# Patient Record
Sex: Male | Born: 1960 | Race: Black or African American | Hispanic: No | Marital: Married | Smoking: Never smoker
Health system: Southern US, Community
[De-identification: ages and names within clinical notes are randomized; demographics above are authoritative.]

## PROBLEM LIST (undated history)

## (undated) ENCOUNTER — Emergency Department (HOSPITAL_COMMUNITY): Discharge status: Against Medical Advice | Payer: BC Managed Care – PPO

## (undated) DIAGNOSIS — E7800 Pure hypercholesterolemia, unspecified: Secondary | ICD-10-CM

## (undated) DIAGNOSIS — M1990 Unspecified osteoarthritis, unspecified site: Secondary | ICD-10-CM

## (undated) DIAGNOSIS — Z87442 Personal history of urinary calculi: Secondary | ICD-10-CM

## (undated) DIAGNOSIS — K219 Gastro-esophageal reflux disease without esophagitis: Secondary | ICD-10-CM

## (undated) HISTORY — PX: CYSTOSCOPY/RETROGRADE/URETEROSCOPY/STONE EXTRACTION WITH BASKET: SHX5317

## (undated) HISTORY — PX: LITHOTRIPSY: SUR834

## (undated) HISTORY — PX: CATARACT EXTRACTION, BILATERAL: SHX1313

## (undated) HISTORY — PX: JOINT REPLACEMENT: SHX530

## (undated) HISTORY — PX: RETINAL DETACHMENT SURGERY: SHX105

---

## 2009-09-22 HISTORY — PX: HERNIA REPAIR: SHX51

## 2010-10-31 ENCOUNTER — Emergency Department (HOSPITAL_COMMUNITY): Payer: BC Managed Care – PPO

## 2010-10-31 ENCOUNTER — Inpatient Hospital Stay (HOSPITAL_COMMUNITY)
Admission: EM | Admit: 2010-10-31 | Discharge: 2010-11-01 | DRG: 310 | Disposition: A | Discharge status: Home / Self Care | Payer: BC Managed Care – PPO | Attending: Urology | Admitting: Urology

## 2010-10-31 ENCOUNTER — Encounter (HOSPITAL_COMMUNITY): Payer: Self-pay | Admitting: Radiology

## 2010-10-31 DIAGNOSIS — N134 Hydroureter: Secondary | ICD-10-CM | POA: Diagnosis present

## 2010-10-31 DIAGNOSIS — N201 Calculus of ureter: Principal | ICD-10-CM | POA: Diagnosis present

## 2010-10-31 DIAGNOSIS — E7800 Pure hypercholesterolemia, unspecified: Secondary | ICD-10-CM | POA: Diagnosis present

## 2010-10-31 DIAGNOSIS — R112 Nausea with vomiting, unspecified: Secondary | ICD-10-CM | POA: Diagnosis present

## 2010-10-31 LAB — DIFFERENTIAL
Lymphocytes Relative: 10 % — ABNORMAL LOW (ref 12–46)
Lymphs Abs: 2.2 10*3/uL (ref 0.7–4.0)
Monocytes Relative: 9 % (ref 3–12)
Neutrophils Relative %: 81 % — ABNORMAL HIGH (ref 43–77)

## 2010-10-31 LAB — COMPREHENSIVE METABOLIC PANEL
ALT: 25 U/L (ref 0–53)
Alkaline Phosphatase: 45 U/L (ref 39–117)
BUN: 15 mg/dL (ref 6–23)
CO2: 28 mEq/L (ref 19–32)
Chloride: 103 mEq/L (ref 96–112)
GFR calc non Af Amer: 58 mL/min — ABNORMAL LOW (ref >60)
Glucose, Bld: 100 mg/dL — ABNORMAL HIGH (ref 70–99)
Potassium: 3.5 mEq/L (ref 3.5–5.1)
Sodium: 139 mEq/L (ref 135–145)
Total Bilirubin: 0.9 mg/dL (ref 0.3–1.2)
Total Protein: 6.6 g/dL (ref 6.0–8.3)

## 2010-10-31 LAB — URINALYSIS, ROUTINE W REFLEX MICROSCOPIC
Bilirubin Urine: NEGATIVE
Ketones, ur: NEGATIVE mg/dL
Nitrite: NEGATIVE
Protein, ur: NEGATIVE mg/dL
pH: 6 (ref 5.0–8.0)

## 2010-10-31 LAB — CBC
HCT: 43.7 % (ref 39.0–52.0)
Hemoglobin: 15.1 g/dL (ref 13.0–17.0)
MCV: 83.4 fL (ref 78.0–100.0)
RBC: 5.24 MIL/uL (ref 4.22–5.81)
WBC: 21.3 10*3/uL — ABNORMAL HIGH (ref 4.0–10.5)

## 2010-10-31 LAB — LIPASE, BLOOD: Lipase: 18 U/L (ref 11–59)

## 2010-10-31 MED ORDER — IOHEXOL 300 MG/ML  SOLN
100.0000 mL | Freq: Once | INTRAMUSCULAR | Status: AC | PRN
  Administered 2010-10-31: 100 mL via INTRAVENOUS

## 2010-11-01 ENCOUNTER — Inpatient Hospital Stay (HOSPITAL_COMMUNITY): Payer: BC Managed Care – PPO

## 2010-11-01 LAB — SURGICAL PCR SCREEN: Staphylococcus aureus: NEGATIVE

## 2010-11-13 NOTE — H&P (Signed)
  NAMEJAIVEON, SUPPES                ACCOUNT NO.:  [id number]  MEDICAL RECORD NO.:  [id number]           PATIENT TYPE:  I  LOCATION:  A307                          FACILITY:  [facility]  PHYSICIAN:  Ky Barban, M.D.DATE OF BIRTH:  [birth year]  DATE OF ADMISSION:  10/31/2010 DATE OF DISCHARGE:  LH                             HISTORY & PHYSICAL   CHIEF COMPLAINT:  Recurrent right renal colic.  HISTORY:  A 50 year old gentleman who came to the emergency room with severe pain in right flank associated with nausea, vomiting started this morning.  No fever, chills, or any voiding difficulty.  He had a CT scan done, which shows that he has multiple small stones in both kidneys, but there is a 6-mm stone in the distal right ureter causing hydroureter. The patient was having considerable pain in the emergency room, so I had to admit him to control his pain.  He is comfortably lying now after having pain medicine.  He has passed at least 4-5 stones before.  PAST MEDICAL HISTORY:  No other significant medical problem except high cholesterol, takes medicine for that.  No history of diabetes or hypertension.  FAMILY HISTORY:  Some of his cousins have kidney stones.  No history of prostate cancer.  PERSONAL HISTORY:  Does not smoke or drink.  REVIEW OF SYSTEMS:  Unremarkable.  PHYSICAL EXAMINATION:  VITAL SIGNS:  Blood pressure 130/80, temperature is normal. CENTRAL NERVOUS SYSTEM:  No gross neurological deficit. HEAD, NECK, EYES, AND ENT:  Negative. CHEST:  Symmetrical. HEART:  Regular sinus rhythm.  No murmur. ABDOMEN:  Soft, flat.  Liver, spleen, kidneys not palpable. EXTERNAL GENITALIA:  Unremarkable. RECTAL:  Deferred.  IMPRESSION:  Right distal ureteral calculus, bilateral renal calculi. Note, I had a long talk with his wife, mother, and mother-in-law, and told them about the problem that the stone is a little bit on the bigger side, may be difficult to pass, but there is  always possibility that he can pass, so he can wait, not do anything, or I can go ahead and remove the stone with a basket then I had discussed the basket procedure in detail and gave them the complication especially, ureteral perforation leading to open surgery, use of double-J stent, stone migration.  They were all discussed and they understand also I discussed the ESL advantages and disadvantages, but I recommended that he undergo stone basket.  He never really had any procedure done before. He has been passing these stones on his own.  So, I will put him on the schedule for tomorrow.  I told him they can keep thinking let me know if they change their mind, but they want me to go ahead and do it tomorrow.     Ky Barban, M.D.     MIJ/MEDQ  D:  10/31/2010  T:  11/01/2010  Job:  [id number]  Electronically Signed by Alleen Borne M.D. on 11/13/2010 11:56:14 AM

## 2010-11-13 NOTE — Op Note (Signed)
  Ethan Hunter, Ethan Hunter                ACCOUNT NO.:  [id number]  MEDICAL RECORD NO.:  [id number]           PATIENT TYPE:  I  LOCATION:  A307                          FACILITY:  [facility]  PHYSICIAN:  Ky Barban, M.D.DATE OF BIRTH:  10/03/1960  DATE OF PROCEDURE:  11/01/2010 DATE OF DISCHARGE:  11/01/2010                              OPERATIVE REPORT   PREOPERATIVE DIAGNOSIS:  Right ureteral calculus.  POSTOPERATIVE DIAGNOSIS:  Right ureteral calculus.  PROCEDURE:  Cystoscopy, right retrograde pyelogram, ureteroscopic stone basket extraction.  ANESTHESIA:  General.  DESCRIPTION OF PROCEDURE:  The patient under general endotracheal anesthesia in lithotomy position, usual prep and drape, #25 cystoscope introduced into the bladder, right ureteral orifice catheterized with a wedge catheter.  Hypaque was injected under fluoroscopic and coral dye goes up into the right upper ureter.  The ureter was dilated markedly above the ureterovesical junction and there was a filling defect in the ureterovesical junction that is the stone.  A guidewire was passed up into the renal pelvis and the intramural ureter was dilated with balloon #15.  Once the ureter was dilated, the balloon was removed.  Cystoscope was removed and I introduced short rigid ureteroscope alongside the ureteroscope went to the level of the stone, stone was visualized, and it was engaged in a basket without any problem and removed, I do not have to break it, stone was completely removed.  Then I inspected the ureters again, the ureter was intact, no problem.  I decided not to leave any double-J stent, so a guidewire was removed, all instruments were removed.  The patient left the operating room in satisfactory condition.     Ky Barban, M.D.     MIJ/MEDQ  D:  11/01/2010  T:  11/02/2010  Job:  [id number]  Electronically Signed by Alleen Borne M.D. on 11/13/2010 11:56:19 AM

## 2010-11-18 NOTE — Discharge Summary (Signed)
  [name], Ethan Hunter                ACCOUNT NO.:  [id number]  MEDICAL RECORD NO.:  [id number]           PATIENT TYPE:  I  LOCATION:  A307                          FACILITY:  [facility]  PHYSICIAN:  Ky Barban, M.D.DATE OF BIRTH:  Jan 05, 1961  DATE OF ADMISSION:  10/31/2010 DATE OF DISCHARGE:  02/10/2012LH                              DISCHARGE SUMMARY   A 50 year old gentleman was admitted in the hospital with right renal colic on October 31, 2010, and discharged home on November 01, 2010.  He came to the emergency room with right flank pain associated with nausea, vomiting, it started that morning.  CT scan showed that he has multiple small stones in both kidneys, but 6-mm stone in the distal right ureter, causing hydroureteronephrosis, so it was decided to do stone basket after doing a routine admission workup which is normal.  He was taken to the operating room on November 01, 2010.  Cystoscopy, right retrograde pyelogram, ureteroscopic stone basket extraction was done, did not leave any double-J stent, and everything went fine, so he was discharged home same day.  We will follow him in the office in 2 weeks.     Ky Barban, M.D.     MIJ/MEDQ  D:  11/14/2010  T:  11/15/2010  Job:  [id number]  Electronically Signed by Alleen Borne M.D. on 11/18/2010 09:36:27 AM

## 2013-01-04 ENCOUNTER — Ambulatory Visit (HOSPITAL_COMMUNITY)
Admission: RE | Admit: 2013-01-04 | Discharge: 2013-01-04 | Disposition: A | Discharge status: Home / Self Care | Payer: BC Managed Care – PPO | Source: Ambulatory Visit | Attending: Urology | Admitting: Urology

## 2013-01-04 ENCOUNTER — Other Ambulatory Visit (HOSPITAL_COMMUNITY): Payer: Self-pay | Admitting: Urology

## 2013-01-04 DIAGNOSIS — N200 Calculus of kidney: Secondary | ICD-10-CM

## 2013-01-04 DIAGNOSIS — Z09 Encounter for follow-up examination after completed treatment for conditions other than malignant neoplasm: Secondary | ICD-10-CM | POA: Insufficient documentation

## 2013-04-05 ENCOUNTER — Ambulatory Visit (HOSPITAL_COMMUNITY)
Admission: RE | Admit: 2013-04-05 | Discharge: 2013-04-05 | Disposition: A | Discharge status: Home / Self Care | Payer: BC Managed Care – PPO | Source: Ambulatory Visit | Attending: Urology | Admitting: Urology

## 2013-04-05 ENCOUNTER — Other Ambulatory Visit (HOSPITAL_COMMUNITY): Payer: Self-pay | Admitting: Urology

## 2013-04-05 DIAGNOSIS — N200 Calculus of kidney: Secondary | ICD-10-CM | POA: Insufficient documentation

## 2013-04-05 DIAGNOSIS — Z09 Encounter for follow-up examination after completed treatment for conditions other than malignant neoplasm: Secondary | ICD-10-CM | POA: Insufficient documentation

## 2013-04-11 ENCOUNTER — Encounter (HOSPITAL_COMMUNITY)
Admission: RE | Admit: 2013-04-11 | Discharge: 2013-04-11 | Disposition: A | Discharge status: Home / Self Care | Payer: BC Managed Care – PPO | Source: Ambulatory Visit | Attending: Urology | Admitting: Urology

## 2013-04-11 ENCOUNTER — Inpatient Hospital Stay (HOSPITAL_COMMUNITY): Admission: RE | Admit: 2013-04-11 | Payer: BC Managed Care – PPO | Source: Ambulatory Visit

## 2013-04-11 ENCOUNTER — Encounter (HOSPITAL_COMMUNITY): Payer: Self-pay

## 2013-04-11 HISTORY — DX: Unspecified osteoarthritis, unspecified site: M19.90

## 2013-04-11 HISTORY — DX: Pure hypercholesterolemia, unspecified: E78.00

## 2013-04-11 NOTE — Patient Instructions (Addendum)
Ethan Hunter  04/11/2013   Your procedure is scheduled on:  04/13/2013  Report to [facility] at  1130  AM.  Call this number if you have problems the morning of surgery: [phone]   Remember:   Do not eat food or drink liquids after midnight.   Take these medicines the morning of surgery with A SIP OF WATER: none   Do not wear jewelry, make-up or nail polish.  Do not wear lotions, powders, or perfumes.   Do not shave 48 hours prior to surgery. Men may shave face and neck.  Do not bring valuables to the hospital.  [facility] is not responsible for any belongings or valuables.  Contacts, dentures or bridgework may not be worn into surgery.  Leave suitcase in the car. After surgery it may be brought to your room.  For patients admitted to the hospital, checkout time is 11:00 AM the day of discharge.   Patients discharged the day of surgery will not be allowed to drive home.  Name and phone number of your driver: wife  Special Instructions: N/A   Please read over the following fact sheets that you were given: Pain Booklet, Coughing and Deep Breathing, Surgical Site Infection Prevention, Anesthesia Post-op Instructions and Care and Recovery After Surgery Lithotripsy for Kidney Stones WHAT ARE KIDNEY STONES? The kidneys filter blood for chemicals the body cannot use. These waste chemicals are eliminated in the urine. They are removed from the body. Under some conditions, these chemicals may become concentrated. When this happens, they form crystals in the urine. When these crystals build up and stick together, stones may form. When these stones block the flow of urine through the urinary tract, they may cause severe pain. The urinary tract is very sensitive to blockage and stretching by the stone. WHAT IS LITHOTRIPSY? Lithotripsy is a treatment that can sometimes help eliminate kidney stones and pain faster. A form of lithotripsy, also known as ESWL (extracorporeal shock wave  lithotripsy), is a nonsurgical procedure that helps your body rid itself of the kidney stone with a minimum amount of pain. EWSL is a method of crushing a kidney stone with shock waves. These shock waves pass through your body. They cause the kidney stones to crumble while still in the urinary tract. It is then easier for the smaller pieces of stone to pass in the urine. Lithotripsy usually takes about an hour. It is done in a hospital, a lithotripsy center, or a mobile unit. It usually does not require an overnight stay. Your caregiver will instruct you on preparation for the procedure. Your caregiver will tell you what to expect afterward. LET YOUR CAREGIVER KNOW ABOUT:  Allergies.  Medicines taken including herbs, eye drops, over the counter medicines (including aspirin, aleve, or motrin for treatment of inflammatory conditions) and creams.  Use of steroids (by mouth or creams).  Previous problems with anesthetics or novocaine.  Possibility of pregnancy, if this applies.  History of blood clots (thrombophlebitis).  History of bleeding or blood problems.  Previous surgery.  Other health problems. RISKS AND COMPLICATIONS Complications of lithotripsy are uncommon, but include the following:  Infection.  Bleeding of the kidney.  Bruising of the kidney or skin.  Obstruction of the ureter (the passageway from the kidney to the bladder).  Failure of the stone to fragment (break apart). PROCEDURE A stent (flexible tube with holes) may be placed in your ureter. The ureter is the tube that transports the urine  from the kidneys to the bladder. Your caregiver may place a stent before the procedure. This will help keep urine flowing from the kidney if the fragments of the stone block the ureter. You may receive an intravenous (IV) line to give you fluids and medicines. These medicines may help you relax or make you sleep. During the procedure, you will lie comfortably on a fluid-filled  cushion or in a warm-water bath. After an x-ray or ultrasound locates your stone, shock waves are aimed at the stone. If you are awake, you may feel a tapping sensation (feeling) as the shock waves pass through your body. If large stone particles remain after treatment, a second procedure may be necessary at a later date. For comfort during the test:  Relax as much as possible.  Try to remain still as much as possible.  Try to follow instructions to speed up the test.  Let your caregiver know if you are uncomfortable, anxious, or in pain. AFTER THE PROCEDURE  After surgery, you will be taken to the recovery area. A nurse will watch and check your progress. Once you're awake, stable, and taking fluids well, you will be allowed to go home as long as there are no problems. You may be prescribed antibiotics (medicines that kill germs) to help prevent infection. You may also be prescribed pain medicine if needed. In a week or two, your doctor may remove your stent, if you have one. Your caregiver will check to see whether or not stone particles remain. PASSING THE STONE It may take anywhere from a day to several weeks for the stone particles to leave your body. During this time, drink at least 8 to 12 eight ounce glasses of water every day. It is normal for your urine to be cloudy or slightly bloody for a few weeks following this procedure. You may even see small pieces of stone in your urine. A slight fever and some pain are also normal. Your caregiver may ask you to strain your urine to collect some stone particles for chemical analysis. If you find particles while straining the urine, save them. Analysis tells you and the caregiver what the stone is made of. Knowing this may help prevent future stones. PREVENTING FUTURE STONES  Drink about 8 to 12, eight-ounce glasses of water every day.  Follow the diet your caregiver recommends.  Take your prescribed medicine.  See your caregiver regularly for  checkups. SEEK IMMEDIATE MEDICAL CARE IF:  You develop an oral temperature above 102 F (38.9 C), or as your caregiver suggests.  Your pain is not relieved by medicine.  You develop nausea (feeling sick to your stomach) and vomiting.  You develop heavy bleeding.  You have difficulty urinating. Document Released: 09/05/2000 Document Revised: 12/01/2011 Document Reviewed: 06/30/2008 [facility] Patient Information 2014 [place], [place]. PATIENT INSTRUCTIONS POST-ANESTHESIA  IMMEDIATELY FOLLOWING SURGERY:  Do not drive or operate machinery for the first twenty four hours after surgery.  Do not make any important decisions for twenty four hours after surgery or while taking narcotic pain medications or sedatives.  If you develop intractable nausea and vomiting or a severe headache please notify your doctor immediately.  FOLLOW-UP:  Please make an appointment with your surgeon as instructed. You do not need to follow up with anesthesia unless specifically instructed to do so.  WOUND CARE INSTRUCTIONS (if applicable):  Keep a dry clean dressing on the anesthesia/puncture wound site if there is drainage.  Once the wound has quit draining you may leave  it open to air.  Generally you should leave the bandage intact for twenty four hours unless there is drainage.  If the epidural site drains for more than 36-48 hours please call the anesthesia department.  QUESTIONS?:  Please feel free to call your physician or the hospital operator if you have any questions, and they will be happy to assist you.

## 2013-04-13 ENCOUNTER — Encounter (HOSPITAL_COMMUNITY)
Admission: RE | Disposition: A | Discharge status: Home / Self Care | Payer: Self-pay | Source: Ambulatory Visit | Attending: Urology

## 2013-04-13 ENCOUNTER — Encounter (HOSPITAL_COMMUNITY): Payer: Self-pay | Admitting: *Deleted

## 2013-04-13 ENCOUNTER — Ambulatory Visit (HOSPITAL_COMMUNITY): Payer: BC Managed Care – PPO

## 2013-04-13 ENCOUNTER — Ambulatory Visit (HOSPITAL_COMMUNITY)
Admission: RE | Admit: 2013-04-13 | Discharge: 2013-04-13 | Disposition: A | Discharge status: Home / Self Care | Payer: BC Managed Care – PPO | Source: Ambulatory Visit | Attending: Urology | Admitting: Urology

## 2013-04-13 DIAGNOSIS — Z5309 Procedure and treatment not carried out because of other contraindication: Secondary | ICD-10-CM | POA: Insufficient documentation

## 2013-04-13 DIAGNOSIS — N200 Calculus of kidney: Secondary | ICD-10-CM | POA: Insufficient documentation

## 2013-04-13 SURGERY — CANCELLED PROCEDURE
Laterality: Right

## 2013-04-13 MED ORDER — DIAZEPAM 5 MG PO TABS
ORAL_TABLET | ORAL | Status: AC
  Filled 2013-04-13: qty 2

## 2013-04-13 MED ORDER — SODIUM CHLORIDE 0.9 % IV SOLN: INTRAVENOUS | Status: DC

## 2013-04-13 MED ORDER — DIPHENHYDRAMINE HCL 25 MG PO CAPS
ORAL_CAPSULE | ORAL | Status: AC
  Filled 2013-04-13: qty 1

## 2013-04-13 MED ORDER — DIAZEPAM 5 MG PO TABS: 10.0000 mg | ORAL_TABLET | Freq: Once | ORAL | Status: DC

## 2013-04-13 MED ORDER — DIPHENHYDRAMINE HCL 25 MG PO CAPS: 25.0000 mg | ORAL_CAPSULE | Freq: Once | ORAL | Status: DC

## 2013-04-13 SURGICAL SUPPLY — 4 items
CLOTH BEACON ORANGE TIMEOUT ST (SAFETY) IMPLANT
GOWN STRL REIN XL XLG (GOWN DISPOSABLE) IMPLANT
SET IRRIGATING DISP (SET/KITS/TRAYS/PACK) IMPLANT
TOWEL OR 17X26 4PK STRL BLUE (TOWEL DISPOSABLE) IMPLANT

## 2013-04-13 NOTE — H&P (Signed)
NAME:  Ethan Hunter, Ethan Hunter                ACCOUNT NO.:  [id number]  MEDICAL RECORD NO.:  [id number]  LOCATION:  APPO                          FACILITY:  [facility]  PHYSICIAN:  Ky Barban, M.D.DATE OF BIRTH:  1961-09-12  DATE OF ADMISSION:  04/13/2013 DATE OF DISCHARGE:  LH                             HISTORY & PHYSICAL   HISTORY OF PRESENT ILLNESS:  Ethan Hunter is a gentleman who is 52 years old and in 2012, he had right ureteral calculus stone, basket done, and stone was removed.  He has a small bilateral renal calculi.  He is symptomatic, the one on the right kidney is easily visible on KUB.  It is 6 x 6 mm stone.  I told him about that and he want to get rid of that.  He is asymptomatic, so he has come as outpatient to undergo ESL right renal calculus.  I have discussed the procedure, need for additional procedures, and he understands, wants me to go ahead and proceed.  No guarantees about the results.  PAST MEDICAL HISTORY:  No history of hypertension and diabetes.  FAMILY HISTORY:  Negative.  PERSONAL HISTORY:  Does not smoke or drink.  REVIEW OF SYSTEMS:  Unremarkable.  PHYSICAL EXAMINATION:  GENERAL:  Moderately built male, not in acute distress, fully conscious, alert, oriented. VITAL SIGNS:  The blood pressure is 108/71, temperature 98.4. CENTRAL NERVOUS SYSTEM:  No gross neurological deficit. HEAD, NECK, EYES, ENT:  Negative. CHEST:  Symmetrical and normal breath sounds. HEART:  Regular sinus rhythm. ABDOMEN:  Soft, flat.  Liver, spleen, kidneys not palpable.  No CVA tenderness. EXTERNAL GENITALIA:  Negative. RECTAL:  Deferred. EXTREMITIES:  Normal.  IMPRESSION:  Bilateral small renal calculi, right renal calculus, 6 mm.  PLAN:  ESL, right renal calculus as outpatient.     Ky Barban, M.D.     MIJ/MEDQ  D:  04/13/2013  T:  04/13/2013  Job:  [id number]

## 2013-04-13 NOTE — OR Nursing (Signed)
Mr. Klem took motrin this morning this am at 0600 with a sip of water.  Dr. Jerre Simon notified and procedure will be rescheduled  For another date.  Patient and wife  going to Dr. Rudean Haskell  Office due to questions.  Xray given to pt. To give to Dr. Jerre Simon.

## 2013-04-14 NOTE — OR Nursing (Signed)
Cancelled due to pt. Taking motrin at 0600 this am. Dr. Jerre Simon notified

## 2013-06-08 ENCOUNTER — Other Ambulatory Visit (HOSPITAL_COMMUNITY): Payer: Self-pay | Admitting: Urology

## 2013-06-08 DIAGNOSIS — N200 Calculus of kidney: Secondary | ICD-10-CM

## 2013-06-13 ENCOUNTER — Ambulatory Visit (HOSPITAL_COMMUNITY): Payer: BC Managed Care – PPO

## 2013-07-27 ENCOUNTER — Other Ambulatory Visit: Payer: Self-pay

## 2013-07-27 ENCOUNTER — Telehealth: Payer: Self-pay

## 2013-07-27 DIAGNOSIS — Z1211 Encounter for screening for malignant neoplasm of colon: Secondary | ICD-10-CM

## 2013-07-28 NOTE — Telephone Encounter (Signed)
Pt's wife called and gave info for triage. She will call back with name of his cholesterol medication for me to complete the triage entry.

## 2013-08-02 MED ORDER — SOD PICOSULFATE-MAG OX-CIT ACD 10-3.5-12 MG-GM-GM PO PACK: 1.0000 | PACK | ORAL | Status: DC

## 2013-08-02 NOTE — Telephone Encounter (Signed)
PREPOPIK-DRINK WATER TO KEEP URINE LIGHT YELLOW.  PT SHOULD DROP OFF RX 3 DAYS PRIOR TO PROCEDURE.  

## 2013-08-02 NOTE — Telephone Encounter (Signed)
Pt called back with name of cholesterol medication and forgot to look on bottle for the mg.

## 2013-08-02 NOTE — Telephone Encounter (Signed)
Gastroenterology Pre-Procedure Review  Request Date: 07/28/2013 Requesting Physician: Pt's wife just called to schedule his first colonoscopy  PATIENT REVIEW QUESTIONS: The patient responded to the following health history questions as indicated:    1. Diabetes Melitis: no 2. Joint replacements in the past 12 months: no 3. Major health problems in the past 3 months: no 4. Has an artificial valve or MVP: no 5. Has a defibrillator: no 6. Has been advised in past to take antibiotics in advance of a procedure like teeth cleaning: no    MEDICATIONS & ALLERGIES:    Patient reports the following regarding taking any blood thinners:   Plavix? no Aspirin? no Coumadin? no  Patient confirms/reports the following medications:  Current Outpatient Prescriptions  Medication Sig Dispense Refill  . NON FORMULARY Simvastatin/ pt unsure mg/ takes one daily       No current facility-administered medications for this visit.    Patient confirms/reports the following allergies:  No Known Allergies  No orders of the defined types were placed in this encounter.    AUTHORIZATION INFORMATION Primary Insurance:   ID #:   Group #:  Pre-Cert / Auth required:  Pre-Cert / Auth #:   Secondary Insurance:   ID #:   Group #:  Pre-Cert / Auth required: Pre-Cert / Auth #:   SCHEDULE INFORMATION: Procedure has been scheduled as follows:  Date: 08/22/2013              Time: 8:30 AM  Location:[facility] Short Stay  This Gastroenterology Pre-Precedure Review Form is being routed to the following provider(s): Jonette Eva, MD

## 2013-08-02 NOTE — Telephone Encounter (Signed)
Rx sent to the pharmacy and instructions mailed to pt.  

## 2013-08-09 ENCOUNTER — Telehealth: Payer: Self-pay

## 2013-08-09 NOTE — Telephone Encounter (Signed)
I called [employer] at [phone] and spoke to [facility], who said a PA is not required for screening colonoscopy.

## 2013-08-16 ENCOUNTER — Encounter (HOSPITAL_COMMUNITY): Payer: Self-pay | Admitting: Pharmacy Technician

## 2013-08-22 ENCOUNTER — Ambulatory Visit (HOSPITAL_COMMUNITY)
Admission: RE | Admit: 2013-08-22 | Discharge: 2013-08-22 | Disposition: A | Discharge status: Home / Self Care | Payer: BC Managed Care – PPO | Source: Ambulatory Visit | Attending: Gastroenterology | Admitting: Gastroenterology

## 2013-08-22 ENCOUNTER — Encounter (HOSPITAL_COMMUNITY): Payer: Self-pay | Admitting: *Deleted

## 2013-08-22 ENCOUNTER — Encounter (HOSPITAL_COMMUNITY)
Admission: RE | Disposition: A | Discharge status: Home / Self Care | Payer: Self-pay | Source: Ambulatory Visit | Attending: Gastroenterology

## 2013-08-22 DIAGNOSIS — Z1211 Encounter for screening for malignant neoplasm of colon: Secondary | ICD-10-CM

## 2013-08-22 DIAGNOSIS — K648 Other hemorrhoids: Secondary | ICD-10-CM | POA: Insufficient documentation

## 2013-08-22 DIAGNOSIS — D126 Benign neoplasm of colon, unspecified: Secondary | ICD-10-CM | POA: Insufficient documentation

## 2013-08-22 DIAGNOSIS — E7800 Pure hypercholesterolemia, unspecified: Secondary | ICD-10-CM | POA: Insufficient documentation

## 2013-08-22 HISTORY — PX: COLONOSCOPY: SHX5424

## 2013-08-22 SURGERY — COLONOSCOPY
Anesthesia: Moderate Sedation

## 2013-08-22 MED ORDER — MEPERIDINE HCL 100 MG/ML IJ SOLN
INTRAMUSCULAR | Status: AC
  Filled 2013-08-22: qty 2

## 2013-08-22 MED ORDER — MIDAZOLAM HCL 5 MG/5ML IJ SOLN
INTRAMUSCULAR | Status: AC
  Filled 2013-08-22: qty 10

## 2013-08-22 MED ORDER — MEPERIDINE HCL 100 MG/ML IJ SOLN
INTRAMUSCULAR | Status: DC | PRN
  Administered 2013-08-22 (×2): 25 mg via INTRAVENOUS

## 2013-08-22 MED ORDER — MIDAZOLAM HCL 5 MG/5ML IJ SOLN
INTRAMUSCULAR | Status: DC | PRN
  Administered 2013-08-22 (×2): 2 mg via INTRAVENOUS

## 2013-08-22 MED ORDER — SODIUM CHLORIDE 0.9 % IV SOLN
INTRAVENOUS | Status: DC
  Administered 2013-08-22: 08:00:00 via INTRAVENOUS

## 2013-08-22 MED ORDER — STERILE WATER FOR IRRIGATION IR SOLN
Status: DC | PRN
  Administered 2013-08-22: 09:00:00

## 2013-08-22 NOTE — Op Note (Signed)
[facility] [address] [place], [address]   COLONOSCOPY PROCEDURE REPORT  PATIENT: Ethan Hunter, Ethan Hunter.  MR#: [id number] BIRTHDATE: [birth year] , 52  yrs. old GENDER: Male ENDOSCOPIST: Jonette Eva, MD REFERRED ON:GEXBM Milam, M.D. PROCEDURE DATE:  08/22/2013 PROCEDURE:   ILEOColonoscopy with snare polypectomy INDICATIONS:Colorectal cancer screening. MEDICATIONS: Demerol 50 mg IV and Versed 4 mg IV  DESCRIPTION OF PROCEDURE:    Physical exam was performed.  Informed consent was obtained from the patient after explaining the benefits, risks, and alternatives to procedure.  The patient was connected to monitor and placed in left lateral position. Continuous oxygen was provided by nasal cannula and IV medicine administered through an indwelling cannula.  After administration of sedation and rectal exam, the patients rectum was intubated and the EC-[id number] ([id number])  colonoscope was advanced under direct visualization to the ileum.  The scope was removed slowly by carefully examining the color, texture, anatomy, and integrity mucosa on the way out.  The patient was recovered in endoscopy and discharged home in satisfactory condition.    COLON FINDINGS: The mucosa appeared normal in the terminal ileum.  , A sessile polyp measuring 6 mm in size was found in the proximal transverse colon.  A polypectomy was performed using snare cautery. The colon mucosa was otherwise normal. Moderate sized internal hemorrhoids were found.  PREP QUALITY: good.  CECAL W/D TIME: 12 minutes     COMPLICATIONS: None  ENDOSCOPIC IMPRESSION: 1.   ONE COLON POLYP REMOVED 2.   Moderate sized internal hemorrhoids  RECOMMENDATIONS: AWAIT BIOPSY HIGH FIBER DIET TCS IN 3 YEARS IF ADVANCED AND 5-10 YEARS IF A SIMPLE ADENOMA       _______________________________ Rosalie DoctorJonette Eva, MD 08/22/2013 9:06 AM

## 2013-08-22 NOTE — H&P (Signed)
  Primary Care Physician:  Zachery Dauer, MD Primary Gastroenterologist:  Dr. Darrick Penna  Pre-Procedure History & Physical: HPI:  Ethan Hunter is a 52 y.o. male here for COLON CANCER SCREENING.  Past Medical History  Diagnosis Date  . Hypercholesteremia   . Arthritis     Past Surgical History  Procedure Laterality Date  . Retinal detachment surgery Bilateral   . Cataract extraction, bilateral Bilateral   . Cystoscopy/retrograde/ureteroscopy/stone extraction with basket      Prior to Admission medications   Medication Sig Start Date End Date Taking? Authorizing Provider  cetirizine (ZYRTEC) 10 MG tablet Take 10 mg by mouth daily.   Yes Historical Provider, MD  fluticasone (FLONASE) 50 MCG/ACT nasal spray Place 1 spray into both nostrils daily as needed for allergies or rhinitis.   Yes Historical Provider, MD  simvastatin (ZOCOR) 40 MG tablet Take 40 mg by mouth at bedtime.   Yes Historical Provider, MD  Sod Picosulfate-Mag Ox-Cit Acd 10-3.5-12 MG-GM-GM PACK Take 1 Container by mouth as directed. 08/02/13  Yes [place] Bali, MD    Allergies as of 07/27/2013  . (No Known Allergies)    History reviewed. No pertinent family history.  History   Social History  . Marital Status: Married    Spouse Name: N/A    Number of Children: N/A  . Years of Education: N/A   Occupational History  . Not on file.   Social History Main Topics  . Smoking status: Never Smoker   . Smokeless tobacco: Not on file  . Alcohol Use: No  . Drug Use: No  . Sexual Activity: Yes    Birth Control/ Protection: None   Other Topics Concern  . Not on file   Social History Narrative  . No narrative on file    Review of Systems: See HPI, otherwise negative ROS   Physical Exam: BP 120/80  Pulse 64  Temp(Src) 97.7 F (36.5 C) (Oral)  Resp 18  SpO2 98% General:   Alert,  pleasant and cooperative in NAD Head:  Normocephalic and atraumatic. Neck:  Supple; Lungs:  Clear throughout to  auscultation.    Heart:  Regular rate and rhythm. Abdomen:  Soft, nontender and nondistended. Normal bowel sounds, without guarding, and without rebound.   Neurologic:  Alert and  oriented x4;  grossly normal neurologically.  Impression/Plan:     SCREENING  Plan:  1. TCS TODAY

## 2013-08-24 ENCOUNTER — Encounter (HOSPITAL_COMMUNITY): Payer: Self-pay | Admitting: Gastroenterology

## 2013-08-30 ENCOUNTER — Telehealth: Payer: Self-pay | Admitting: Gastroenterology

## 2013-08-30 NOTE — Telephone Encounter (Addendum)
PLEASE CALL PT. HE HAD ONE SMALL POLYP REMOVED BUT WHEN THE SPECIMEN WAS SENT TO PATHOLOGY IT WAS NOT COLLECTED. FOLLOW A HIGH FIBER DIET. NEXT TCS IN 5 YEARS.

## 2013-08-30 NOTE — Telephone Encounter (Signed)
LMOM to call.

## 2013-09-06 NOTE — Telephone Encounter (Signed)
Letter mailed to pt to call for results.  

## 2013-09-12 ENCOUNTER — Telehealth: Payer: Self-pay | Admitting: Gastroenterology

## 2013-09-12 NOTE — Telephone Encounter (Signed)
Pt called to see if his tcs results were back. I told him that you were at lunch and he said that he would call back when he gets off work.

## 2013-09-12 NOTE — Telephone Encounter (Signed)
Pt aware of results 

## 2013-09-12 NOTE — Telephone Encounter (Signed)
Pt returned call and was informed. He is on recall for 08/2018.

## 2014-10-05 ENCOUNTER — Encounter (HOSPITAL_COMMUNITY): Payer: Self-pay | Admitting: Urology

## 2014-10-09 ENCOUNTER — Encounter (HOSPITAL_COMMUNITY): Payer: Self-pay | Admitting: *Deleted

## 2014-10-09 ENCOUNTER — Inpatient Hospital Stay (HOSPITAL_COMMUNITY)
Admission: EM | Admit: 2014-10-09 | Discharge: 2014-10-12 | DRG: 378 | Disposition: A | Discharge status: Home / Self Care | Payer: BLUE CROSS/BLUE SHIELD | Attending: Internal Medicine | Admitting: Internal Medicine

## 2014-10-09 ENCOUNTER — Emergency Department (HOSPITAL_COMMUNITY): Payer: BLUE CROSS/BLUE SHIELD

## 2014-10-09 DIAGNOSIS — K260 Acute duodenal ulcer with hemorrhage: Secondary | ICD-10-CM | POA: Diagnosis not present

## 2014-10-09 DIAGNOSIS — R55 Syncope and collapse: Secondary | ICD-10-CM | POA: Diagnosis not present

## 2014-10-09 DIAGNOSIS — K921 Melena: Secondary | ICD-10-CM | POA: Insufficient documentation

## 2014-10-09 DIAGNOSIS — K264 Chronic or unspecified duodenal ulcer with hemorrhage: Secondary | ICD-10-CM | POA: Insufficient documentation

## 2014-10-09 DIAGNOSIS — K922 Gastrointestinal hemorrhage, unspecified: Secondary | ICD-10-CM

## 2014-10-09 DIAGNOSIS — D62 Acute posthemorrhagic anemia: Secondary | ICD-10-CM | POA: Diagnosis present

## 2014-10-09 DIAGNOSIS — K449 Diaphragmatic hernia without obstruction or gangrene: Secondary | ICD-10-CM | POA: Diagnosis present

## 2014-10-09 DIAGNOSIS — E785 Hyperlipidemia, unspecified: Secondary | ICD-10-CM | POA: Diagnosis present

## 2014-10-09 DIAGNOSIS — R9431 Abnormal electrocardiogram [ECG] [EKG]: Secondary | ICD-10-CM

## 2014-10-09 DIAGNOSIS — I951 Orthostatic hypotension: Secondary | ICD-10-CM | POA: Diagnosis present

## 2014-10-09 LAB — COMPREHENSIVE METABOLIC PANEL
ALBUMIN: 4 g/dL (ref 3.5–5.2)
ALT: 18 U/L (ref 0–53)
AST: 18 U/L (ref 0–37)
Alkaline Phosphatase: 63 U/L (ref 39–117)
Anion gap: 6 (ref 5–15)
BUN: 37 mg/dL — AB (ref 6–23)
CHLORIDE: 107 meq/L (ref 96–112)
CO2: 24 mmol/L (ref 19–32)
Calcium: 8.7 mg/dL (ref 8.4–10.5)
Creatinine, Ser: 0.82 mg/dL (ref 0.50–1.35)
Glucose, Bld: 90 mg/dL (ref 70–99)
POTASSIUM: 4.7 mmol/L (ref 3.5–5.1)
SODIUM: 137 mmol/L (ref 135–145)
Total Bilirubin: 0.6 mg/dL (ref 0.3–1.2)
Total Protein: 6.5 g/dL (ref 6.0–8.3)

## 2014-10-09 LAB — LIPASE, BLOOD: Lipase: <10 U/L — ABNORMAL LOW (ref 11–59)

## 2014-10-09 LAB — PROTIME-INR
INR: 0.97 (ref 0.00–1.49)
PROTHROMBIN TIME: 13 s (ref 11.6–15.2)

## 2014-10-09 LAB — TROPONIN I

## 2014-10-09 LAB — CBC
HCT: 37.9 % — ABNORMAL LOW (ref 39.0–52.0)
HEMOGLOBIN: 12.5 g/dL — AB (ref 13.0–17.0)
MCH: 28.4 pg (ref 26.0–34.0)
MCHC: 33 g/dL (ref 30.0–36.0)
MCV: 86.1 fL (ref 78.0–100.0)
PLATELETS: 317 10*3/uL (ref 150–400)
RBC: 4.4 MIL/uL (ref 4.22–5.81)
RDW: 14.3 % (ref 11.5–15.5)
WBC: 9.7 10*3/uL (ref 4.0–10.5)

## 2014-10-09 LAB — APTT: APTT: 27 s (ref 24–37)

## 2014-10-09 LAB — I-STAT CG4 LACTIC ACID, ED: Lactic Acid, Venous: 1.56 mmol/L (ref 0.5–2.2)

## 2014-10-09 LAB — POC OCCULT BLOOD, ED: Fecal Occult Bld: POSITIVE — AB

## 2014-10-09 MED ORDER — SODIUM CHLORIDE 0.9 % IV SOLN
8.0000 mg/h | INTRAVENOUS | Status: DC
  Administered 2014-10-09 – 2014-10-11 (×4): 8 mg/h via INTRAVENOUS
  Filled 2014-10-09 (×11): qty 80

## 2014-10-09 MED ORDER — SODIUM CHLORIDE 0.9 % IV SOLN
1000.0000 mL | Freq: Once | INTRAVENOUS | Status: AC
  Administered 2014-10-09: 1000 mL via INTRAVENOUS

## 2014-10-09 MED ORDER — ONDANSETRON HCL 4 MG/2ML IJ SOLN
4.0000 mg | Freq: Once | INTRAMUSCULAR | Status: DC
  Filled 2014-10-09: qty 2

## 2014-10-09 MED ORDER — MORPHINE SULFATE 4 MG/ML IJ SOLN
4.0000 mg | Freq: Once | INTRAMUSCULAR | Status: AC
Start: 2014-10-09 — End: 2014-10-09
  Administered 2014-10-09: 4 mg via INTRAVENOUS
  Filled 2014-10-09: qty 1

## 2014-10-09 MED ORDER — SODIUM CHLORIDE 0.9 % IV SOLN
80.0000 mg | Freq: Once | INTRAVENOUS | Status: DC
  Filled 2014-10-09: qty 80

## 2014-10-09 MED ORDER — PANTOPRAZOLE SODIUM 40 MG IV SOLR: 40.0000 mg | Freq: Two times a day (BID) | INTRAVENOUS | Status: DC

## 2014-10-09 MED ORDER — PANTOPRAZOLE SODIUM 40 MG IV SOLR
40.0000 mg | Freq: Once | INTRAVENOUS | Status: AC
  Administered 2014-10-09: 40 mg via INTRAVENOUS
  Filled 2014-10-09: qty 40

## 2014-10-09 MED ORDER — ONDANSETRON HCL 4 MG/2ML IJ SOLN
4.0000 mg | Freq: Once | INTRAMUSCULAR | Status: AC
  Administered 2014-10-09: 4 mg via INTRAVENOUS
  Filled 2014-10-09: qty 2

## 2014-10-09 MED ORDER — PANTOPRAZOLE SODIUM 40 MG IV SOLR
INTRAVENOUS | Status: AC
  Filled 2014-10-09: qty 80

## 2014-10-09 MED ORDER — SODIUM CHLORIDE 0.9 % IV SOLN
1000.0000 mL | INTRAVENOUS | Status: DC
  Administered 2014-10-09 – 2014-10-10 (×3): 1000 mL via INTRAVENOUS

## 2014-10-09 NOTE — ED Notes (Signed)
Pt reports dizziness this afternoon.  Reports having 2 bloody stools in past 90 minutes.

## 2014-10-09 NOTE — ED Provider Notes (Signed)
CSN: [id number]     Arrival date & time 10/09/14  2044 History  This chart was scribed for Ezequiel Essex, MD by Molli Posey, ED Scribe. This patient was seen in room APA08/APA08 and the patient's care was started 11:00 PM.    Chief Complaint  Patient presents with  . GI Bleeding    HPI HPI Comments: Ethan Hunter is a 54 y.o. male with a history of hypercholesteremia who presents to the Emergency Department complaining of GI bleeding that started at [facility] this evening. Pt reports associated constant, lower abdominal pain that started around 4PM today. He reports having 3 bloody stools since onset. Pt says his stools have been loose and black in color. His wife says that after the first time he had a bloody stool she saw him pass out and fall in the bathroom. She says that pt hit his head on the bathroom wall. He reports no similar prior episodes. He states he is on no blood thinning medication. He reports he has never drank alcohol heavy in the past. Pt states he received a colonoscopy last year and had a polyp. Pt reports NKDA. Denies vomiting, CP and SOB.     Past Medical History  Diagnosis Date  . Hypercholesteremia   . Arthritis    Past Surgical History  Procedure Laterality Date  . Retinal detachment surgery Bilateral   . Cataract extraction, bilateral Bilateral   . Cystoscopy/retrograde/ureteroscopy/stone extraction with basket    . Colonoscopy N/A 08/22/2013    Procedure: COLONOSCOPY;  Surgeon: Danie Binder, MD;  Location: [facility];  Service: Endoscopy;  Laterality: N/A;  8:30 AM   No family history on file. History  Substance Use Topics  . Smoking status: Never Smoker   . Smokeless tobacco: Not on file  . Alcohol Use: No    Review of Systems  A complete 10 system review of systems was obtained and all systems are negative except as noted in the HPI and PMH.    Allergies  Review of patient's allergies indicates no known allergies.  Home Medications    Prior to Admission medications   Medication Sig Start Date End Date Taking? Authorizing Provider  fluticasone (FLONASE) 50 MCG/ACT nasal spray Place 1 spray into both nostrils daily as needed for allergies or rhinitis.   Yes Historical Provider, MD  simvastatin (ZOCOR) 40 MG tablet Take 40 mg by mouth at bedtime.   Yes Historical Provider, MD  Sod Picosulfate-Mag Ox-Cit Acd 10-3.5-12 MG-GM-GM PACK Take 1 Container by mouth as directed. Patient not taking: Reported on 10/09/2014 08/02/13   Danie Binder, MD   BP 90/63 mmHg  Pulse 83  Temp(Src) 99.1 F (37.3 C) (Oral)  Resp 14  Ht 6\' 2"  (1.88 m)  Wt 200 lb (90.719 kg)  BMI 25.67 kg/m2  SpO2 98% Physical Exam  Constitutional: He is oriented to person, place, and time. He appears well-developed and well-nourished. No distress.  Appears pale.     HENT:  Head: Normocephalic and atraumatic.  Mouth/Throat: Oropharynx is clear and moist. No oropharyngeal exudate.  Eyes: Conjunctivae and EOM are normal. Pupils are equal, round, and reactive to light.  Neck: Normal range of motion. Neck supple.  No meningismus.  Cardiovascular: Normal rate, regular rhythm, normal heart sounds and intact distal pulses.   No murmur heard. Pulmonary/Chest: Effort normal and breath sounds normal. No respiratory distress.  Abdominal: Soft. There is tenderness. There is no rebound and no guarding.  Diffuse lower abdominal tenderness.  Genitourinary:  Black stool, FOBT positive. Small external hemmroid   Musculoskeletal: Normal range of motion. He exhibits no edema or tenderness.  Neurological: He is alert and oriented to person, place, and time. No cranial nerve deficit. He exhibits normal muscle tone. Coordination normal.  No ataxia on finger to nose bilaterally. No pronator drift. 5/5 strength throughout. CN 2-12 intact. Negative Romberg. Equal grip strength. Sensation intact. Gait is normal.   Skin: Skin is warm.  Psychiatric: He has a normal mood and  affect. His behavior is normal.  Nursing note and vitals reviewed.   ED Course  Procedures   DIAGNOSTIC STUDIES: Oxygen Saturation is 100% on RA, normal by my interpretation.    COORDINATION OF CARE: 11:08 PM Discussed treatment plan with pt at bedside and pt agreed to plan.   Labs Review Labs Reviewed  CBC - Abnormal; Notable for the following:    Hemoglobin 12.5 (*)    HCT 37.9 (*)    All other components within normal limits  COMPREHENSIVE METABOLIC PANEL - Abnormal; Notable for the following:    BUN 37 (*)    All other components within normal limits  LIPASE, BLOOD - Abnormal; Notable for the following:    Lipase <10 (*)    All other components within normal limits  POC OCCULT BLOOD, ED - Abnormal; Notable for the following:    Fecal Occult Bld POSITIVE (*)    All other components within normal limits  PROTIME-INR  APTT  TROPONIN I  TROPONIN I  I-STAT CG4 LACTIC ACID, ED  TYPE AND SCREEN    Imaging Review Ct Head Wo Contrast  10/10/2014   CLINICAL DATA:  Syncope in the setting of GI bleeding.  EXAM: CT HEAD WITHOUT CONTRAST  TECHNIQUE: Contiguous axial images were obtained from the base of the skull through the vertex without intravenous contrast.  COMPARISON:  None.  FINDINGS: Skull and Sinuses:The completely opacified left maxillary sinus (note the lower portion is not visualized) is atelectatic. These are the imaging findings of silent sinus syndrome.  Orbits: Bilateral cataract resection and left scleral banding. No acute findings.  Brain: No evidence of acute infarction, hemorrhage, hydrocephalus, or mass lesion/mass effect.  IMPRESSION: 1. Negative intracranial imaging. 2. Chronically obstructed left maxillary sinus.   Electronically Signed   By: Jorje Guild M.D.   On: 10/10/2014 00:42   Ct Abdomen Pelvis W Contrast  10/10/2014   CLINICAL DATA:  Pt. Is complaining of GI bleeding that started at [facility] this evening. Pt reports associated constant, lower abdominal  pain that started around 4PM today. He reports having 3 bloody stools since onset. Pt says his stools have been loose and black in color. His wife says that after the first time he had a bloody stool she saw him pass out and fall in the bathroom. She says that pt hit his head on the bathroom wall  EXAM: CT ABDOMEN AND PELVIS WITH CONTRAST  TECHNIQUE: Multidetector CT imaging of the abdomen and pelvis was performed using the standard protocol following bolus administration of intravenous contrast.  CONTRAST:  61mL OMNIPAQUE IOHEXOL 300 MG/ML SOLN, 129mL OMNIPAQUE IOHEXOL 300 MG/ML SOLN  COMPARISON:  10/31/2010  FINDINGS: Air-fluid levels are noted in a nondistended colon. There is no colonic wall thickening or mesenteric inflammation. Small bowel is unremarkable. Appendix not visualized. No acute appendicitis.  Clear lung bases.  Heart normal in size.  Small hiatal hernia.  Liver, spleen, gallbladder, pancreas, adrenal glands:  Normal.  Bilateral nonobstructing intrarenal stones.  No renal masses. No hydronephrosis. Normal ureters. Normal bladder.  No adenopathy.  No ascites.  Arthropathic changes of the hips, right greater than left. Minor degenerative changes of the visualized spine. No osteoblastic or osteolytic lesions.  IMPRESSION: 1. Air-fluid levels noted in an otherwise unremarkable colon. No bowel mass or inflammatory change. Cause of the GI bleeding is unclear. 2. Normal small bowel. 3. Nonobstructing bilateral intrarenal stones. No ureteral stone or obstructive uropathy. 4. Arthropathic changes of the hips, right greater than left. 5. Minor degenerative changes of the visualized spine. No other abnormalities.   Electronically Signed   By: Lajean Manes M.D.   On: 10/10/2014 00:43     EKG Interpretation   Date/Time:  Tuesday October 10 2014 01:39:12 EST Ventricular Rate:  88 PR Interval:  146 QRS Duration: 89 QT Interval:  362 QTC Calculation: 438 R Axis:   58 Text Interpretation:  Sinus rhythm  Abnormal R-wave progression, early  transition Borderline ST elevation, lateral leads No significant change  was found Confirmed by Wyvonnia Dusky  MD, Marly Schuld [phone]) on 10/10/2014 1:50:11  AM      MDM   Final diagnoses:  Syncope  Gastrointestinal hemorrhage with melena   3 episodes of melanotic stools since 4 PM. Lower abdominal pain that is constant. Feeling lightheaded and nauseated with witnessed syncopal episode in the bathroom hitting his head on the wall. No chest pain or shortness of breath.  Patient is tachycardic with lower abdominal tenderness. No chest pain or shortness of breath. Melena on rectal exam.  EKG with ST elevation in inferior and lateral leads. No comparison. Patient denies chest pain. Discussed with on-call interventional is Dr. Gwenlyn Found. He feels patient is not STEMI at this time given his ongoing GI bleed and chest pain. Recommends troponins and transfer to [facility].  [facility] start IV fluids, IV PPI, antiemetics, labs. Hemoglobin 12.5 from 15. Hold transfusion at this time.  Blood pressure dropped to 90 systolic in the ED. Additional bolus of fluid given with improvement to 300 systolic. Repeat EKG unchanged.  Discussed with Dr. Darrick Meigs will arrange transfer to [facility]. Discussed with Dr. Elias Else of cardiology who will consult on patient. Patient continues to have no chest pain.   CRITICAL CARE Performed by: Ezequiel Essex Total critical care time: 45 Critical care time was exclusive of separately billable procedures and treating other patients. Critical care was necessary to treat or prevent imminent or life-threatening deterioration. Critical care was time spent personally by me on the following activities: development of treatment plan with patient and/or surrogate as well as nursing, discussions with consultants, evaluation of patient's response to treatment, examination of patient, obtaining history from patient or surrogate, ordering and performing  treatments and interventions, ordering and review of laboratory studies, ordering and review of radiographic studies, pulse oximetry and re-evaluation of patient's condition.   I personally performed the services described in this documentation, which was scribed in my presence. The recorded information has been reviewed and is accurate.   Ezequiel Essex, MD 10/10/14 [phone]

## 2014-10-10 ENCOUNTER — Encounter (HOSPITAL_COMMUNITY): Payer: Self-pay | Admitting: Family Medicine

## 2014-10-10 DIAGNOSIS — R55 Syncope and collapse: Secondary | ICD-10-CM | POA: Diagnosis present

## 2014-10-10 DIAGNOSIS — K449 Diaphragmatic hernia without obstruction or gangrene: Secondary | ICD-10-CM | POA: Diagnosis present

## 2014-10-10 DIAGNOSIS — R9431 Abnormal electrocardiogram [ECG] [EKG]: Secondary | ICD-10-CM

## 2014-10-10 DIAGNOSIS — D62 Acute posthemorrhagic anemia: Secondary | ICD-10-CM | POA: Diagnosis not present

## 2014-10-10 DIAGNOSIS — K922 Gastrointestinal hemorrhage, unspecified: Secondary | ICD-10-CM

## 2014-10-10 DIAGNOSIS — E785 Hyperlipidemia, unspecified: Secondary | ICD-10-CM | POA: Diagnosis not present

## 2014-10-10 DIAGNOSIS — R0989 Other specified symptoms and signs involving the circulatory and respiratory systems: Secondary | ICD-10-CM

## 2014-10-10 DIAGNOSIS — I951 Orthostatic hypotension: Secondary | ICD-10-CM | POA: Diagnosis not present

## 2014-10-10 DIAGNOSIS — K260 Acute duodenal ulcer with hemorrhage: Secondary | ICD-10-CM | POA: Diagnosis not present

## 2014-10-10 DIAGNOSIS — K921 Melena: Secondary | ICD-10-CM

## 2014-10-10 LAB — COMPREHENSIVE METABOLIC PANEL
ALT: 17 U/L (ref 0–53)
AST: 21 U/L (ref 0–37)
Albumin: 3.2 g/dL — ABNORMAL LOW (ref 3.5–5.2)
Alkaline Phosphatase: 57 U/L (ref 39–117)
Anion gap: 6 (ref 5–15)
BUN: 20 mg/dL (ref 6–23)
CO2: 22 mmol/L (ref 19–32)
Calcium: 8.2 mg/dL — ABNORMAL LOW (ref 8.4–10.5)
Chloride: 110 mEq/L (ref 96–112)
Creatinine, Ser: 0.88 mg/dL (ref 0.50–1.35)
GFR calc Af Amer: >90 mL/min (ref >90)
GFR calc non Af Amer: >90 mL/min (ref >90)
Glucose, Bld: 83 mg/dL (ref 70–99)
POTASSIUM: 4.1 mmol/L (ref 3.5–5.1)
Sodium: 138 mmol/L (ref 135–145)
TOTAL PROTEIN: 5.5 g/dL — AB (ref 6.0–8.3)
Total Bilirubin: 0.9 mg/dL (ref 0.3–1.2)

## 2014-10-10 LAB — TYPE AND SCREEN
ABO/RH(D): O POS
Antibody Screen: NEGATIVE

## 2014-10-10 LAB — HEMOGLOBIN AND HEMATOCRIT, BLOOD
HCT: 33.5 % — ABNORMAL LOW (ref 39.0–52.0)
HCT: 31.8 % — ABNORMAL LOW (ref 39.0–52.0)
HCT: 33.2 % — ABNORMAL LOW (ref 39.0–52.0)
HEMOGLOBIN: 10.9 g/dL — AB (ref 13.0–17.0)
Hemoglobin: 11.2 g/dL — ABNORMAL LOW (ref 13.0–17.0)
Hemoglobin: 10.4 g/dL — ABNORMAL LOW (ref 13.0–17.0)

## 2014-10-10 LAB — TROPONIN I

## 2014-10-10 LAB — MRSA PCR SCREENING: MRSA BY PCR: NEGATIVE

## 2014-10-10 MED ORDER — IOHEXOL 300 MG/ML  SOLN
100.0000 mL | Freq: Once | INTRAMUSCULAR | Status: AC | PRN
  Administered 2014-10-10: 100 mL via INTRAVENOUS

## 2014-10-10 MED ORDER — SODIUM CHLORIDE 0.9 % IV SOLN
1000.0000 mL | INTRAVENOUS | Status: DC
  Administered 2014-10-10 – 2014-10-11 (×2): 1000 mL via INTRAVENOUS

## 2014-10-10 MED ORDER — SIMVASTATIN 40 MG PO TABS
40.0000 mg | ORAL_TABLET | Freq: Every day | ORAL | Status: DC
  Administered 2014-10-10 – 2014-10-11 (×2): 40 mg via ORAL
  Filled 2014-10-10 (×3): qty 1

## 2014-10-10 MED ORDER — ACETAMINOPHEN 325 MG PO TABS: 650.0000 mg | ORAL_TABLET | Freq: Four times a day (QID) | ORAL | Status: DC | PRN

## 2014-10-10 MED ORDER — IOHEXOL 300 MG/ML  SOLN
50.0000 mL | Freq: Once | INTRAMUSCULAR | Status: AC | PRN
  Administered 2014-10-10: 50 mL via ORAL

## 2014-10-10 MED ORDER — ACETAMINOPHEN 650 MG RE SUPP: 650.0000 mg | Freq: Four times a day (QID) | RECTAL | Status: DC | PRN

## 2014-10-10 MED ORDER — MORPHINE SULFATE 2 MG/ML IJ SOLN: 2.0000 mg | INTRAMUSCULAR | Status: DC | PRN

## 2014-10-10 MED ORDER — ONDANSETRON HCL 4 MG/2ML IJ SOLN: 4.0000 mg | Freq: Four times a day (QID) | INTRAMUSCULAR | Status: DC | PRN

## 2014-10-10 MED ORDER — ONDANSETRON HCL 4 MG PO TABS: 4.0000 mg | ORAL_TABLET | Freq: Four times a day (QID) | ORAL | Status: DC | PRN

## 2014-10-10 NOTE — ED Notes (Signed)
Explained to patient the delay in which the doctor would be in the room.

## 2014-10-10 NOTE — Consult Note (Signed)
[facility] Consult: 1:12 PM 10/10/2014  LOS: 1 day    Referring Provider:  Dia Crawford, MD Primary Care Physician:  Quentin Cornwall, MD ion [facility] Primary Gastroenterologist:  Dr. Barney Drain in [place]    Reason for Consultation:  GI bleed    HPI: Ethan Hunter is a 54 y.o. male.  Resident of [place], [place].   history kidney stones and high cholesterol.  Yesterday evening he developed dark stools with cramping abdominal pain. He was diaphoretic. If these at home. He had syncopal spells. He was seen in the ED at [facility] and sent to [facility] hospital for admission.   initial hemoglobin 12.5 with normal MCV. Today the hemoglobin has dropped to 10.4. He has not required blood transfusion.  BUN was elevated at 37  Patient had screening colonoscopy in December 2014 and results are noted below. He has never undergone upper endoscopy. He does not use aspirin or NSAID products.  He doesn't consume alcoholic beverages and has never smoked cigarettes. His wife says that he belches a lot but this causes him no other symptoms. ID he denies heartburn, dysphagia, nausea. Due to death of his older brother on 10-16-22 as well as of his pastor on January 13, the patient has not been eating as much as usual and he's lost about 12 pounds in the last several weeks. Patient had no chest pain. No shortness of breath. He had a abdominal/pelvic CT scan this morning which showed no worrisome intestinal, gastric or biliary issues. A head CT was also reassuring with negative intracranial imaging but did show chronic left maxillary sinusitis obstruction.    Past Medical History  Diagnosis Date  . Hypercholesteremia   . Arthritis     Past Surgical History  Procedure Laterality Date  . Retinal detachment surgery Bilateral   .  Cataract extraction, bilateral Bilateral   . Cystoscopy/retrograde/ureteroscopy/stone extraction with basket    . Colonoscopy N/A 08/22/2013    Procedure: COLONOSCOPY;  Surgeon: Danie Binder, MD;  Location: [facility];  Service: Endoscopy;  Laterality: N/A;  8:30 AM    Prior to Admission medications   Medication Sig Start Date End Date Taking? Authorizing Provider  fluticasone (FLONASE) 50 MCG/ACT nasal spray Place 1 spray into both nostrils daily as needed for allergies or rhinitis.   Yes Historical Provider, MD  simvastatin (ZOCOR) 40 MG tablet Take 40 mg by mouth at bedtime.   Yes Historical Provider, MD  Sod Picosulfate-Mag Ox-Cit Acd 10-3.5-12 MG-GM-GM PACK Take 1 Container by mouth as directed. Patient not taking: Reported on 10/09/2014 08/02/13   Danie Binder, MD    Scheduled Meds: . [START ON 10/13/2014] pantoprazole (PROTONIX) IV  40 mg Intravenous Q12H  . simvastatin  40 mg Oral QHS   Infusions: . sodium chloride    . pantoprozole (PROTONIX) infusion 8 mg/hr (10/10/14 0938)   PRN Meds: acetaminophen **OR** acetaminophen, morphine injection, ondansetron **OR** ondansetron (ZOFRAN) IV   Allergies as of 10/09/2014  . (No Known Allergies)    History reviewed. No pertinent family history.  History   Social History  . Marital Status: Married    Spouse Name: N/A    Number of Children: N/A  . Years of Education: N/A   Occupational History  . Forklift operator     [employer]   Social History Main Topics  . Smoking status: Never Smoker   . Smokeless tobacco: Not on file  . Alcohol Use: No  . Drug Use: No  . Sexual Activity: Yes    Birth Control/ Protection: None   Other Topics Concern  . Not on file   Social History Narrative    REVIEW OF SYSTEMS: Constitutional:   generally no activity limitations. He is able to work a full-time job. ENT:  No nose bleeds Pulm:   no dyspnea, no cough. CV:  No palpitations, no LE edema.  GU:  No hematuria, no frequency GI:    perHPI Heme:  Per HPI   Transfusions:  None ever Neuro:  No headaches, no peripheral tingling or numbness Derm:  No itching, no rash or sores.  Endocrine:  No sweats or chills.  No polyuria or dysuria Immunization:  Not queried.  Travel:   has been traveling back and forth to [facility] in recent months because that is where his brother was living.    PHYSICAL EXAM: Vital signs in last 24 hours: Filed Vitals:   10/10/14 1135  BP: 127/75  Pulse: 89  Temp: 98.1 F (36.7 C)  Resp: 14   Wt Readings from Last 3 Encounters:  10/10/14 188 lb 3.2 oz (85.367 kg)  04/11/13 202 lb (91.627 kg)    General:  pleasant, healthy-appearing, alert. Comfortable Head:   no signs of trauma, no facial edema, no facial asymmetry.  Eyes:   no icterus, no conjunctival pallor Ear: No hearing deficit  Nose:   no congestion, no discharge Mouth:   clear, pink and moist oral mucous membranes. Dentition in good repair NecNo JVD, no bruits, no TMG, no mass Lung:  CTA bil.  No cough or SOB Heart: RRR no MRG. abdomen:  Soft, NT, ND.  no HSM. No mass. No bruit. No hernias   Rectal: Deferred. Did see smart phone based photo which shows a very dark stool leaching red blood into the commode water.    Musc/Skeltl: No joint swelling, contractures or other deformities.  Extremities:  No CCE. Neurologic:  Oriented 3. No limb weakness. No tremors. No gross deficits. Skin:  no telangiectasia, no rash.  Small laceration near the right elbow Tattoos:None seen Nodes: No cervical adenopathy. Psych: pleasant, relaxed, in good spirits.  Intake/Output from previous day: 01/18 0701 - 01/19 0700 In: 600 [I.V.:600] Out: -  Intake/Output this shift: Total I/O In: 600 [I.V.:600] Out: 600 [Urine:600]  LAB RESULTS:  Recent Labs  10/09/14 2119 10/10/14 0432 10/10/14 1105  WBC 9.7  --   --   HGB 12.5* 11.2* 10.4*  HCT 37.9* 33.5* 31.8*  PLT 317  --   --    BMET Lab Results  Component Value Date     NA 138 10/10/2014   NA 137 10/09/2014   NA 139 10/31/2010   K 4.1 10/10/2014   K 4.7 10/09/2014   K 3.5 10/31/2010   CL 110 10/10/2014   CL 107 10/09/2014   CL 103 10/31/2010   CO2 22 10/10/2014   CO2 24 10/09/2014   CO2 28 10/31/2010   GLUCOSE 83 10/10/2014   GLUCOSE 90 10/09/2014   GLUCOSE 100* 10/31/2010   BUN 20 10/10/2014  BUN 37* 10/09/2014   BUN 15 10/31/2010   CREATININE 0.88 10/10/2014   CREATININE 0.82 10/09/2014   CREATININE 1.32 10/31/2010   CALCIUM 8.2* 10/10/2014   CALCIUM 8.7 10/09/2014   CALCIUM 8.8 10/31/2010   LFT  Recent Labs  10/09/14 2119 10/10/14 0432  PROT 6.5 5.5*  ALBUMIN 4.0 3.2*  AST 18 21  ALT 18 17  ALKPHOS 63 57  BILITOT 0.6 0.9   PT/INR Lab Results  Component Value Date   INR 0.97 10/09/2014   Lipase     Component Value Date/Time   LIPASE <10* 10/09/2014 2307    Drugs of Abuse  No results found for: LABOPIA, COCAINSCRNUR, LABBENZ, AMPHETMU, THCU, LABBARB   RADIOLOGY STUDIES: Ct Head Wo Contrast  10/10/2014   CLINICAL DATA:  Syncope in the setting of GI bleeding.  EXAM: CT HEAD WITHOUT CONTRAST  TECHNIQUE: Contiguous axial images were obtained from the base of the skull through the vertex without intravenous contrast.  COMPARISON:  None.  FINDINGS: Skull and Sinuses:The completely opacified left maxillary sinus (note the lower portion is not visualized) is atelectatic. These are the imaging findings of silent sinus syndrome.  Orbits: Bilateral cataract resection and left scleral banding. No acute findings.  Brain: No evidence of acute infarction, hemorrhage, hydrocephalus, or mass lesion/mass effect.  IMPRESSION: 1. Negative intracranial imaging. 2. Chronically obstructed left maxillary sinus.   Electronically Signed   By: Jorje Guild M.D.   On: 10/10/2014 00:42   Ct Abdomen Pelvis W Contrast  10/10/2014   CLINICAL DATA:  Pt. Is complaining of GI bleeding that started at [facility] this evening. Pt reports associated constant,  lower abdominal pain that started around 4PM today. He reports having 3 bloody stools since onset. Pt says his stools have been loose and black in color. His wife says that after the first time he had a bloody stool she saw him pass out and fall in the bathroom. She says that pt hit his head on the bathroom wall  EXAM: CT ABDOMEN AND PELVIS WITH CONTRAST  TECHNIQUE: Multidetector CT imaging of the abdomen and pelvis was performed using the standard protocol following bolus administration of intravenous contrast.  CONTRAST:  8mL OMNIPAQUE IOHEXOL 300 MG/ML SOLN, 116mL OMNIPAQUE IOHEXOL 300 MG/ML SOLN  COMPARISON:  10/31/2010  FINDINGS: Air-fluid levels are noted in a nondistended colon. There is no colonic wall thickening or mesenteric inflammation. Small bowel is unremarkable. Appendix not visualized. No acute appendicitis.  Clear lung bases.  Heart normal in size.  Small hiatal hernia.  Liver, spleen, gallbladder, pancreas, adrenal glands:  Normal.  Bilateral nonobstructing intrarenal stones. No renal masses. No hydronephrosis. Normal ureters. Normal bladder.  No adenopathy.  No ascites.  Arthropathic changes of the hips, right greater than left. Minor degenerative changes of the visualized spine. No osteoblastic or osteolytic lesions.  IMPRESSION: 1. Air-fluid levels noted in an otherwise unremarkable colon. No bowel mass or inflammatory change. Cause of the GI bleeding is unclear. 2. Normal small bowel. 3. Nonobstructing bilateral intrarenal stones. No ureteral stone or obstructive uropathy. 4. Arthropathic changes of the hips, right greater than left. 5. Minor degenerative changes of the visualized spine. No other abnormalities.   Electronically Signed   By: Lajean Manes M.D.   On: 10/10/2014 00:43    ENDOSCOPIC STUDIES: 08/2013 screening colonoscopy with Dr. Barney Drain. Medium sized internal hemorrhoids. Removal of transverse colon polyp. Pathology on this showed fecal/vegetable  material.   IMPRESSION:   *  Upper GI bleed.  With associated syncope.  Rule out ulcer.  *  ABL anemia. Has not required transfusions thus far.  *  Non-obstructing renal stones    PLAN:     *  Arrange for upper endoscopy tomorrow mid day.  For now we will leave the IV Protonix drip in place.  *  Can have clears today.    Azucena Freed  10/10/2014, 1:12 PM Pager: [phone]  GI ATTENDING  History, laboratories, prior colonoscopy report reviewed. Patient personally seen and examined. Family members in room. Agree with H&P as outlined above. Patient presents with acute upper GI bleed and syncope. Now stable without bowel movement since admission. Agree with volume resuscitation, IV PPI, and plans for upper endoscopy.The nature of the procedure, as well as the risks, benefits, and alternatives were carefully and thoroughly reviewed with the patient. Ample time for discussion and questions allowed. The patient understood, was satisfied, and agreed to proceed.. As the patient is stable, endoscopy scheduled for a.m. Sooner if needed should hemodynamic instability develop.  Docia Chuck. Geri Seminole., M.D. [facility] Division of Gastroenterology

## 2014-10-10 NOTE — Progress Notes (Signed)
As instucted by admitting physician, this NP called cardio fellow upon pt's arrival to [facility]. Dr. Michail Sermon aware of pt's arrival and need for consult.

## 2014-10-10 NOTE — Progress Notes (Signed)
Utilization Review Completed.  

## 2014-10-10 NOTE — H&P (Addendum)
PCP:   Quentin Cornwall, MD   Chief Complaint:  Passed out  HPI: 54 year old male who   has a past medical history of Hypercholesteremia and Arthritis. Today presents to the ED after patient had 3 bloody stools this afternoon. Patient complained of lower abdominal pain was started on 4 PM and after that he had bloody bowel movement and then passed out and fell in the bathroom. Patient's wife held the patient and was moving into the bedroom when he again passed out. As per the wife patient as her only for a couple of minutes. Patient was brought to the ED for further evaluation. Patient had colonoscopy in 2014, at that time he was found to have moderate-sized internal hemorrhoids as well as a polyp was removed. In the ED patient was found to be hypotensive, with positive orthostatic hypotension. Also had mild ST elevation in the inferior leads 23 aVF as well as leads V5 and V6. Cardiology Dr. Alvester Chou on call was consulted by the ED physician Dr. Wyvonnia Dusky, cardiologist reviewed the EKG and recommended transfer to [facility] stepdown. No plans for taking for cath lab at this time as patient did not have chest pain, and also has lower GI bleed. Patient denies chest pain but had sweating, also complain of nausea but no vomiting. Patient says that he has ongoing lower abdominal pain. CT abdomen pelvis no significant abnormality to explain GI bleeding.  Allergies:  No Known Allergies    Past Medical History  Diagnosis Date  . Hypercholesteremia   . Arthritis     Past Surgical History  Procedure Laterality Date  . Retinal detachment surgery Bilateral   . Cataract extraction, bilateral Bilateral   . Cystoscopy/retrograde/ureteroscopy/stone extraction with basket    . Colonoscopy N/A 08/22/2013    Procedure: COLONOSCOPY;  Surgeon: Danie Binder, MD;  Location: [facility];  Service: Endoscopy;  Laterality: N/A;  8:30 AM    Prior to Admission medications   Medication Sig Start Date End Date  Taking? Authorizing Provider  fluticasone (FLONASE) 50 MCG/ACT nasal spray Place 1 spray into both nostrils daily as needed for allergies or rhinitis.   Yes Historical Provider, MD  simvastatin (ZOCOR) 40 MG tablet Take 40 mg by mouth at bedtime.   Yes Historical Provider, MD  Sod Picosulfate-Mag Ox-Cit Acd 10-3.5-12 MG-GM-GM PACK Take 1 Container by mouth as directed. Patient not taking: Reported on 10/09/2014 08/02/13   Danie Binder, MD    Social History:  reports that he has never smoked. He does not have any smokeless tobacco history on file. He reports that he does not drink alcohol or use illicit drugs.  Positive family history of coronary disease, patient's father had bypass surgery and died at the age of 15  All the positives are listed in BOLD  Review of Systems:  HEENT: Headache, blurred vision, runny nose, sore throat Neck: Hypothyroidism, hyperthyroidism,,lymphadenopathy Chest : Shortness of breath, history of COPD, Asthma Heart : Chest pain, history of coronary arterey disease GI:  Nausea, vomiting, diarrhea, constipation, GERD GU: Dysuria, urgency, frequency of urination, hematuria Neuro: Stroke, seizures, syncope Psych: Depression, anxiety, hallucinations   Physical Exam: Blood pressure 91/58, pulse 80, temperature 99.1 F (37.3 C), temperature source Oral, resp. rate 17, height 6\' 2"  (1.88 m), weight 90.719 kg (200 lb), SpO2 98 %. Constitutional:   Patient is a well-developed and well-nourished *male in no acute distress and cooperative with exam. Head: Normocephalic and atraumatic Mouth: Mucus membranes moist Eyes: PERRL, EOMI, conjunctivae  normal Neck: Supple, No Thyromegaly Cardiovascular: RRR, S1 normal, S2 normal Pulmonary/Chest: CTAB, no wheezes, rales, or rhonchi Abdominal: Soft. Non-tender, non-distended, bowel sounds are normal, no masses, organomegaly, or guarding present.  Neurological: A&O x3, Strength is normal and symmetric bilaterally, cranial nerve  II-XII are grossly intact, no focal motor deficit, sensory intact to light touch bilaterally.  Extremities : No Cyanosis, Clubbing or Edema  Labs on Admission:  Basic Metabolic Panel:  Recent Labs Lab 10/09/14 2119  NA 137  K 4.7  CL 107  CO2 24  GLUCOSE 90  BUN 37*  CREATININE 0.82  CALCIUM 8.7   Liver Function Tests:  Recent Labs Lab 10/09/14 2119  AST 18  ALT 18  ALKPHOS 63  BILITOT 0.6  PROT 6.5  ALBUMIN 4.0    Recent Labs Lab 10/09/14 2307  LIPASE <10*   No results for input(s): AMMONIA in the last 168 hours. CBC:  Recent Labs Lab 10/09/14 2119  WBC 9.7  HGB 12.5*  HCT 37.9*  MCV 86.1  PLT 317   Cardiac Enzymes:  Recent Labs Lab 10/09/14 2307  TROPONINI <0.03    BNP (last 3 results) No results for input(s): PROBNP in the last 8760 hours. CBG: No results for input(s): GLUCAP in the last 168 hours.  Radiological Exams on Admission: Ct Head Wo Contrast  10/10/2014   CLINICAL DATA:  Syncope in the setting of GI bleeding.  EXAM: CT HEAD WITHOUT CONTRAST  TECHNIQUE: Contiguous axial images were obtained from the base of the skull through the vertex without intravenous contrast.  COMPARISON:  None.  FINDINGS: Skull and Sinuses:The completely opacified left maxillary sinus (note the lower portion is not visualized) is atelectatic. These are the imaging findings of silent sinus syndrome.  Orbits: Bilateral cataract resection and left scleral banding. No acute findings.  Brain: No evidence of acute infarction, hemorrhage, hydrocephalus, or mass lesion/mass effect.  IMPRESSION: 1. Negative intracranial imaging. 2. Chronically obstructed left maxillary sinus.   Electronically Signed   By: Jorje Guild M.D.   On: 10/10/2014 00:42   Ct Abdomen Pelvis W Contrast  10/10/2014   CLINICAL DATA:  Pt. Is complaining of GI bleeding that started at [facility] this evening. Pt reports associated constant, lower abdominal pain that started around 4PM today. He reports having  3 bloody stools since onset. Pt says his stools have been loose and black in color. His wife says that after the first time he had a bloody stool she saw him pass out and fall in the bathroom. She says that pt hit his head on the bathroom wall  EXAM: CT ABDOMEN AND PELVIS WITH CONTRAST  TECHNIQUE: Multidetector CT imaging of the abdomen and pelvis was performed using the standard protocol following bolus administration of intravenous contrast.  CONTRAST:  71mL OMNIPAQUE IOHEXOL 300 MG/ML SOLN, 167mL OMNIPAQUE IOHEXOL 300 MG/ML SOLN  COMPARISON:  10/31/2010  FINDINGS: Air-fluid levels are noted in a nondistended colon. There is no colonic wall thickening or mesenteric inflammation. Small bowel is unremarkable. Appendix not visualized. No acute appendicitis.  Clear lung bases.  Heart normal in size.  Small hiatal hernia.  Liver, spleen, gallbladder, pancreas, adrenal glands:  Normal.  Bilateral nonobstructing intrarenal stones. No renal masses. No hydronephrosis. Normal ureters. Normal bladder.  No adenopathy.  No ascites.  Arthropathic changes of the hips, right greater than left. Minor degenerative changes of the visualized spine. No osteoblastic or osteolytic lesions.  IMPRESSION: 1. Air-fluid levels noted in an otherwise unremarkable colon. No bowel  mass or inflammatory change. Cause of the GI bleeding is unclear. 2. Normal small bowel. 3. Nonobstructing bilateral intrarenal stones. No ureteral stone or obstructive uropathy. 4. Arthropathic changes of the hips, right greater than left. 5. Minor degenerative changes of the visualized spine. No other abnormalities.   Electronically Signed   By: Lajean Manes M.D.   On: 10/10/2014 00:43    EKG: Independently reviewed. ST elevation in lateral and inferior leads   Assessment/Plan Active Problems:   Syncope   Lower GI bleeding   ST elevation  Syncope Likely due to GI bleeding, will continue IV fluids. Patient's hemoglobin is 12.5, his previous low globin  was 15.1 in February 2012. Will continue to monitor the hemoglobin and hematocrit  Lower GI bleed ? Cause, colonoscopy in 2014 revealed moderate internal hemorrhoids as well as polyp which was removed. Patient's stool was found to be guaiac positive. Continue Protonix infusion  ST elevation Patient does not have chest pain, chronic enzymes are negative so far Patient's EKG shows ST elevation in the  inferior and lateral leads. Cardiology was consulted and they recommended patient to transfer to [facility] stepdown No plans for intervention at this time Will not be able to give heparin because of the GI bleed Will follow cardiac enzymes to 6 hours 3  Called and discussed with Dr. Jennette Kettle, [facility]  Code status: Full code  Family discussion: Admission, patients condition and plan of care including tests being ordered have been discussed with the patient and his wife at bedside who indicate understanding and agree with the plan and Code Status.   Time Spent on Admission: 60 min  [facility] Pager: [phone] 10/10/2014, 2:05 AM  If 7PM-7AM, please contact night-coverage  [email or web address]  Password [email or web address]

## 2014-10-10 NOTE — Progress Notes (Signed)
[place] TEAM 1 - Stepdown/ICU TEAM Progress Note  BRIER REID [id number] DOB: 1961/08/25 DOA: 10/09/2014 PCP: Quentin Cornwall, MD  Admit HPI / Brief Narrative: 54 year old BM PMHx Hypercholesteremia and Arthritis. Today presents to the ED after patient had 3 bloody stools this afternoon. Patient complained of lower abdominal pain was started on 4 PM and after that he had bloody bowel movement and then passed out and fell in the bathroom. Patient's wife held the patient and was moving into the bedroom when he again passed out. As per the wife patient as her only for a couple of minutes. Patient was brought to the ED for further evaluation. Patient had colonoscopy in 2014, at that time he was found to have moderate-sized internal hemorrhoids as well as a polyp was removed. In the ED patient was found to be hypotensive, with positive orthostatic hypotension. Also had mild ST elevation in the inferior leads 2,3 aVF as well as leads V5 and V6. Cardiology Dr. Alvester Chou on call was consulted by the ED physician Dr. Wyvonnia Dusky, cardiologist reviewed the EKG and recommended transfer to [facility] stepdown. No plans for taking for cath lab at this time as patient did not have chest pain, and also has lower GI bleed. Patient denies chest pain but had sweating, also complain of nausea but no vomiting. Patient says that he has ongoing lower abdominal pain. CT abdomen pelvis no significant abnormality to explain GI bleeding.   HPI/Subjective: 1/19 A/O 4, currently asymptomatic  Assessment/Plan: Syncope -Likely due to GI bleeding, will continue IV fluids. Patient's hemoglobin is 12.5, his previous low globin was 15.1 in February 2012. -Contacted GI this a.m. colonoscopy?  -Continue to monitor hemoglobin  Lower GI bleed -Cause, colonoscopy in 2014 revealed moderate internal hemorrhoids as well as polyp which was removed. -Patient's stool was found to be guaiac positive. -Continue Protonix infusion -Await  GI recommendations  ST elevation -Patient does not have chest pain. -Troponin 3 negative -Patient's EKG shows ST elevation in the inferior and lateral leads. -Cardiology was consulted and they recommended patient to transfer to [facility] stepdown -Cardiology recommends no further workup    Code Status: FULL Family Communication: no family present at time of exam Disposition Plan: Per GI    Consultants: Dr. Cletus Gash (cardiology)    Procedure/Significant Events: 1/18 CT abdomen pelvis with contrast;-air-fluid levels noted in an otherwise unremarkable colon. -Cause of the GI bleeding is unclear. - Nonobstructing bilateral intrarenal stones. No ureteral stone or obstructive uropathy.   Culture NA  Antibiotics: NA  DVT prophylaxis: SCD   Devices NA   LINES / TUBES:  NA    Continuous Infusions: . sodium chloride 1,000 mL (10/10/14 0938)  . pantoprozole (PROTONIX) infusion 8 mg/hr (10/10/14 7829)    Objective: VITAL SIGNS: Temp: 98.2 F (36.8 C) (01/19 0825) Temp Source: Oral (01/19 0825) BP: 124/62 mmHg (01/19 0825) Pulse Rate: 102 (01/19 0825) SPO2; FIO2:   Intake/Output Summary (Last 24 hours) at 10/10/14 1013 Last data filed at 10/10/14 0938  Gross per 24 hour  Intake    600 ml  Output    600 ml  Net      0 ml     Exam: General: A/O 4, NAD, No acute respiratory distress Lungs: Clear to auscultation bilaterally without wheezes or crackles Cardiovascular: Regular rate and rhythm without murmur gallop or rub normal S1 and S2 Abdomen: Nontender, nondistended, soft, bowel sounds positive, no rebound, no ascites, no appreciable mass Extremities: No significant cyanosis,  clubbing, or edema bilateral lower extremities  Data Reviewed: Basic Metabolic Panel:  Recent Labs Lab 10/09/14 2119 10/10/14 0432  NA 137 138  K 4.7 4.1  CL 107 110  CO2 24 22  GLUCOSE 90 83  BUN 37* 20  CREATININE 0.82 0.88  CALCIUM 8.7 8.2*    Liver Function Tests:  Recent Labs Lab 10/09/14 2119 10/10/14 0432  AST 18 21  ALT 18 17  ALKPHOS 63 57  BILITOT 0.6 0.9  PROT 6.5 5.5*  ALBUMIN 4.0 3.2*    Recent Labs Lab 10/09/14 2307  LIPASE <10*   No results for input(s): AMMONIA in the last 168 hours. CBC:  Recent Labs Lab 10/09/14 2119 10/10/14 0432  WBC 9.7  --   HGB 12.5* 11.2*  HCT 37.9* 33.5*  MCV 86.1  --   PLT 317  --    Cardiac Enzymes:  Recent Labs Lab 10/09/14 2307 10/10/14 0146 10/10/14 0432  TROPONINI <0.03 <0.03 <0.03   BNP (last 3 results) No results for input(s): PROBNP in the last 8760 hours. CBG: No results for input(s): GLUCAP in the last 168 hours.  Recent Results (from the past 240 hour(s))  MRSA PCR Screening     Status: None   Collection Time: 10/10/14  4:00 AM  Result Value Ref Range Status   MRSA by PCR NEGATIVE NEGATIVE Final    Comment:        The GeneXpert MRSA Assay (FDA approved for NASAL specimens only), is one component of a comprehensive MRSA colonization surveillance program. It is not intended to diagnose MRSA infection nor to guide or monitor treatment for MRSA infections.      Studies:  Recent x-ray studies have been reviewed in detail by the Attending Physician  Scheduled Meds:  Scheduled Meds: . [START ON 10/13/2014] pantoprazole (PROTONIX) IV  40 mg Intravenous Q12H  . simvastatin  40 mg Oral QHS    Time spent on care of this patient: 40 mins   Allie Bossier , MD   [facility] Office  [phone] Pager - [phone]  On-Call/Text Page:      [email or web address]      password [email or web address]  If 7PM-7AM, please contact night-coverage [email or web address] Password [email or web address] 10/10/2014, 10:13 AM   LOS: 1 day

## 2014-10-10 NOTE — Consult Note (Signed)
CARDIOLOGY CONSULT NOTE  Patient ID: Ethan Hunter, MRN: [id number], DOB/AGE: 52-Sep-1962 52 y.o. Admit date: 10/09/2014 Date of Consult: 10/10/2014  Primary Physician: Quentin Cornwall, MD Primary Cardiologist: none  Chief Complaint: bloody stools, syncope Reason for Consultation: EKG concerning  HPI: 54 y.o. male w/ PMHx significant for elevated cholesterol who initially presented to [facility] after having two bloody bowel movements and syncopal episode at home x 2. His wife is present and provides additional history. No cardiac history beyond the high cholesterol. Very active, never with chest pain. No chest pain during any of today's events. No SOB, nausea or vomiting. Had/has abdominal pain. Two large black loose tarry stools at home. While at home, after rising from the toilet, he slumped over and passed out for a < 1 minute. No injuries. Passed out again transisitioning to the bedroom.  Went to [facility] and EKG demonstrated sinus rhythm with elevation in the ST in V3-V6, I II and II without reciprocal changes. qlets in inferior leads. Int cardiology consulted and pt transferred to [facility] for further care and management of cardiac and GI issues.  Repeat EKG done with inferior qlets, very subtle J point elevation in V4-6. Remains completely chest pain free.   Past Medical History  Diagnosis Date  . Hypercholesteremia   . Arthritis       Surgical History:  Past Surgical History  Procedure Laterality Date  . Retinal detachment surgery Bilateral   . Cataract extraction, bilateral Bilateral   . Cystoscopy/retrograde/ureteroscopy/stone extraction with basket    . Colonoscopy N/A 08/22/2013    Procedure: COLONOSCOPY;  Surgeon: Danie Binder, MD;  Location: [facility];  Service: Endoscopy;  Laterality: N/A;  8:30 AM     Home Meds: Prior to Admission medications   Medication Sig Start Date End Date Taking? Authorizing Provider  fluticasone (FLONASE) 50 MCG/ACT nasal spray Place 1 spray into  both nostrils daily as needed for allergies or rhinitis.   Yes Historical Provider, MD  simvastatin (ZOCOR) 40 MG tablet Take 40 mg by mouth at bedtime.   Yes Historical Provider, MD  Sod Picosulfate-Mag Ox-Cit Acd 10-3.5-12 MG-GM-GM PACK Take 1 Container by mouth as directed. Patient not taking: Reported on 10/09/2014 08/02/13   Danie Binder, MD    Inpatient Medications:  . [START ON 10/13/2014] pantoprazole (PROTONIX) IV  40 mg Intravenous Q12H  . simvastatin  40 mg Oral QHS   . sodium chloride Stopped (10/10/14 0221)  . pantoprozole (PROTONIX) infusion 8 mg/hr (10/09/14 2345)    Allergies: No Known Allergies  History   Social History  . Marital Status: Married    Spouse Name: N/A    Number of Children: N/A  . Years of Education: N/A   Occupational History  . Not on file.   Social History Main Topics  . Smoking status: Never Smoker   . Smokeless tobacco: Not on file  . Alcohol Use: No  . Drug Use: No  . Sexual Activity: Yes    Birth Control/ Protection: None   Other Topics Concern  . Not on file   Social History Narrative     No family history on file.   Review of Systems: General: negative for chills, fever, night sweats or weight changes.  Cardiovascular: see HPI Dermatological: negative for rash Respiratory: negative for cough or wheezing Urologic: negative for hematuria Abdominal: see HPI Neurologic: negative for visual changes All other systems reviewed and are otherwise negative except as noted above.  Labs:  Recent Labs  10/09/14 2307 10/10/14 0146  TROPONINI <0.03 <0.03   Lab Results  Component Value Date   WBC 9.7 10/09/2014   HGB 12.5* 10/09/2014   HCT 37.9* 10/09/2014   MCV 86.1 10/09/2014   PLT 317 10/09/2014    Recent Labs Lab 10/09/14 2119  NA 137  K 4.7  CL 107  CO2 24  BUN 37*  CREATININE 0.82  CALCIUM 8.7  PROT 6.5  BILITOT 0.6  ALKPHOS 63  ALT 18  AST 18  GLUCOSE 90   No results found for: CHOL, HDL, LDLCALC,  TRIG No results found for: DDIMER  Radiology/Studies:  Ct Head Wo Contrast  10/10/2014   CLINICAL DATA:  Syncope in the setting of GI bleeding.  EXAM: CT HEAD WITHOUT CONTRAST  TECHNIQUE: Contiguous axial images were obtained from the base of the skull through the vertex without intravenous contrast.  COMPARISON:  None.  FINDINGS: Skull and Sinuses:The completely opacified left maxillary sinus (note the lower portion is not visualized) is atelectatic. These are the imaging findings of silent sinus syndrome.  Orbits: Bilateral cataract resection and left scleral banding. No acute findings.  Brain: No evidence of acute infarction, hemorrhage, hydrocephalus, or mass lesion/mass effect.  IMPRESSION: 1. Negative intracranial imaging. 2. Chronically obstructed left maxillary sinus.   Electronically Signed   By: Jorje Guild M.D.   On: 10/10/2014 00:42   Ct Abdomen Pelvis W Contrast  10/10/2014   CLINICAL DATA:  Pt. Is complaining of GI bleeding that started at [facility] this evening. Pt reports associated constant, lower abdominal pain that started around 4PM today. He reports having 3 bloody stools since onset. Pt says his stools have been loose and black in color. His wife says that after the first time he had a bloody stool she saw him pass out and fall in the bathroom. She says that pt hit his head on the bathroom wall  EXAM: CT ABDOMEN AND PELVIS WITH CONTRAST  TECHNIQUE: Multidetector CT imaging of the abdomen and pelvis was performed using the standard protocol following bolus administration of intravenous contrast.  CONTRAST:  33mL OMNIPAQUE IOHEXOL 300 MG/ML SOLN, 137mL OMNIPAQUE IOHEXOL 300 MG/ML SOLN  COMPARISON:  10/31/2010  FINDINGS: Air-fluid levels are noted in a nondistended colon. There is no colonic wall thickening or mesenteric inflammation. Small bowel is unremarkable. Appendix not visualized. No acute appendicitis.  Clear lung bases.  Heart normal in size.  Small hiatal hernia.  Liver, spleen,  gallbladder, pancreas, adrenal glands:  Normal.  Bilateral nonobstructing intrarenal stones. No renal masses. No hydronephrosis. Normal ureters. Normal bladder.  No adenopathy.  No ascites.  Arthropathic changes of the hips, right greater than left. Minor degenerative changes of the visualized spine. No osteoblastic or osteolytic lesions.  IMPRESSION: 1. Air-fluid levels noted in an otherwise unremarkable colon. No bowel mass or inflammatory change. Cause of the GI bleeding is unclear. 2. Normal small bowel. 3. Nonobstructing bilateral intrarenal stones. No ureteral stone or obstructive uropathy. 4. Arthropathic changes of the hips, right greater than left. 5. Minor degenerative changes of the visualized spine. No other abnormalities.   Electronically Signed   By: Lajean Manes M.D.   On: 10/10/2014 00:43    EKG: sinus, j point elevation in V4-V6, rather normal appearing EKG now  Physical Exam: Blood pressure 114/75, pulse 85, temperature 98.3 F (36.8 C), temperature source Oral, resp. rate 7, height 6\' 2"  (1.88 m), weight 85.367 kg (188 lb 3.2 oz), SpO2 97 %. General: Well developed, well nourished, in  no acute distress. Head: Normocephalic, atraumatic, sclera non-icteric, no xanthomas, nares are without discharge.  Neck: Supple. Negative for carotid bruits. JVD not elevated. Lungs: Clear bilaterally to auscultation without wheezes, rales, or rhonchi. Breathing is unlabored. Heart: RRR with S1 S2. No murmurs, rubs, or gallops appreciated. Abdomen: Soft,No obvious abdominal masses. Msk:  Strength and tone appear normal for age. Extremities: No clubbing or cyanosis. No edema.  Distal pedal pulses are 2+ and equal bilaterally. Neuro: Alert and oriented X 3. Moves all extremities spontaneously. Fatigued appearing Psych:  Responds to questions appropriately with a normal affect.   Problem List 1. GI bleed, history consistent with lower source 2. Abnormal EKG, now virtually normal (?lead placement  and slight jpoint elevation) 3. Hyperlipidemia 4. Syncope- orthostatic.  Assessment and Plan:  54 y.o. male w/ PMHx significant for elevated cholesterol who initially presented to [facility] after having two bloody bowel movements and syncopal episode at home x 2.   In regards to the EKG changes, they have virtually resolved and his most current EKG demonstrates maybe slight J point elevation. Furthermore he is completely asymptomatic in terms of cardiac symptoms and finally, his troponins of negative x 2. This is good news as it indicates that his heart is not having any ischemia and workup is essentially complete. Would not continue to cycle enzymes after this next set if negative (unless new symptoms occur).  The syncope described is orthostatic in nature and is due to acute blood loss. An echo would be reasonable for completeness sake but is not urgent and should better done once more stable.  Thank you for this consult. Please call with questions. No further cardiology followup required at this point.   Signed, Elias Else, Pallas Wahlert C. MD 10/10/2014, 4:39 AM

## 2014-10-10 NOTE — Consult Note (Signed)
   See consultation from earlier this AM.  No chest pain.  No enzyme elevation.  No acute EKG changes.  Active issue of GI bleeding.  No further cardiac work up at this time.

## 2014-10-11 ENCOUNTER — Encounter (HOSPITAL_COMMUNITY)
Admission: EM | Disposition: A | Discharge status: Home / Self Care | Payer: Self-pay | Source: Home / Self Care | Attending: Internal Medicine

## 2014-10-11 ENCOUNTER — Inpatient Hospital Stay (HOSPITAL_COMMUNITY): Payer: BLUE CROSS/BLUE SHIELD | Admitting: Anesthesiology

## 2014-10-11 ENCOUNTER — Encounter (HOSPITAL_COMMUNITY): Payer: Self-pay | Admitting: *Deleted

## 2014-10-11 ENCOUNTER — Encounter (HOSPITAL_COMMUNITY): Payer: Self-pay | Admitting: Anesthesiology

## 2014-10-11 DIAGNOSIS — K264 Chronic or unspecified duodenal ulcer with hemorrhage: Secondary | ICD-10-CM | POA: Insufficient documentation

## 2014-10-11 HISTORY — PX: ESOPHAGOGASTRODUODENOSCOPY: SHX5428

## 2014-10-11 LAB — CBC
HCT: 31.8 % — ABNORMAL LOW (ref 39.0–52.0)
Hemoglobin: 10.3 g/dL — ABNORMAL LOW (ref 13.0–17.0)
MCH: 27.4 pg (ref 26.0–34.0)
MCHC: 32.4 g/dL (ref 30.0–36.0)
MCV: 84.6 fL (ref 78.0–100.0)
PLATELETS: 300 10*3/uL (ref 150–400)
RBC: 3.76 MIL/uL — AB (ref 4.22–5.81)
RDW: 14.5 % (ref 11.5–15.5)
WBC: 5.6 10*3/uL (ref 4.0–10.5)

## 2014-10-11 LAB — BASIC METABOLIC PANEL
Anion gap: 7 (ref 5–15)
BUN: 9 mg/dL (ref 6–23)
CHLORIDE: 106 meq/L (ref 96–112)
CO2: 26 mmol/L (ref 19–32)
CREATININE: 1.01 mg/dL (ref 0.50–1.35)
Calcium: 8.5 mg/dL (ref 8.4–10.5)
GFR calc Af Amer: >90 mL/min (ref >90)
GFR calc non Af Amer: 84 mL/min — ABNORMAL LOW (ref >90)
GLUCOSE: 87 mg/dL (ref 70–99)
POTASSIUM: 3.9 mmol/L (ref 3.5–5.1)
SODIUM: 139 mmol/L (ref 135–145)

## 2014-10-11 SURGERY — EGD (ESOPHAGOGASTRODUODENOSCOPY)
Anesthesia: Monitor Anesthesia Care

## 2014-10-11 MED ORDER — ONDANSETRON HCL 4 MG/2ML IJ SOLN
INTRAMUSCULAR | Status: DC | PRN
  Administered 2014-10-11: 4 mg via INTRAVENOUS

## 2014-10-11 MED ORDER — PROPOFOL INFUSION 10 MG/ML OPTIME
INTRAVENOUS | Status: DC | PRN
  Administered 2014-10-11: 120 ug/kg/min via INTRAVENOUS

## 2014-10-11 MED ORDER — SODIUM CHLORIDE 0.9 % IV SOLN: INTRAVENOUS | Status: DC

## 2014-10-11 MED ORDER — PROPOFOL INFUSION 10 MG/ML OPTIME
INTRAVENOUS | Status: DC | PRN
  Administered 2014-10-11: 100 mL via INTRAVENOUS

## 2014-10-11 MED ORDER — LIDOCAINE HCL (CARDIAC) 20 MG/ML IV SOLN
INTRAVENOUS | Status: DC | PRN
  Administered 2014-10-11: 100 mg via INTRAVENOUS

## 2014-10-11 MED ORDER — ONDANSETRON HCL 4 MG/2ML IJ SOLN: 4.0000 mg | Freq: Once | INTRAMUSCULAR | Status: DC | PRN

## 2014-10-11 MED ORDER — PANTOPRAZOLE SODIUM 40 MG PO TBEC
40.0000 mg | DELAYED_RELEASE_TABLET | Freq: Two times a day (BID) | ORAL | Status: DC
Start: 2014-10-11 — End: 2014-10-12
  Administered 2014-10-11 – 2014-10-12 (×2): 40 mg via ORAL
  Filled 2014-10-11 (×2): qty 1

## 2014-10-11 NOTE — Progress Notes (Signed)
[place] TEAM 1 - Stepdown/ICU TEAM Progress Note  Ethan Hunter GDJ:[id number] DOB: 07/18/1961 DOA: 10/09/2014 PCP: Quentin Cornwall, MD  Admit HPI / Brief Narrative: 54 year old M Hx Hypercholesteremia and Arthritis who presented to the ED after 3 bloody stools. Patient complained of lower abdominal pain followed by a bloody bowel movement and then syncope. Patient's wife helped the patient get up and was moving into the bedroom when he again passed out. Patient had colonoscopy in 2014 and was found to have moderate-sized internal hemorrhoids as well as a polyp was removed.  In the ED patient was found to be hypotensive, and had mild ST elevation in 2,3 aVF, V5, and V6. Cardiology was consulted by the ED physician and recommended transfer to [facility] stepdown. Marland Kitchen  HPI/Subjective: No new complaints at this time.  No cp, sob, n/v, or abdom pain.    Assessment/Plan:  Syncope -due to GI bleeding - ambulate - f/u orthostatics in AM  Acute blood loss anemia  -due to GIB - Hgb appears to have reached a nadir of 10.3 - recheck in AM to assure has truly reached a nadir  Lower GI bleed due to acute duodenal ulcer -protonix 40mg  BID for 8 weeks - avoid NSAIDs - f/u CLO test   ST elevation -troponin 3 negative w/o chest pain - Cardiology recommended no further workup at this time  Code Status: FULL Family Communication: spoke to wife and mother at bedside at length  Disposition Plan: probable d/c in AM if bp stable, tolerates regular diet, and Hgb stable/improved   Consultants: Cardiology GI  Procedures: EGD 1/20 - acute duodenal ulcer without active bleeding   Antibiotics: none  DVT prophylaxis: SCD  Objective: Blood pressure 126/80, pulse 84, temperature 97.9 F (36.6 C), temperature source Oral, resp. rate 21, height 6\' 2"  (1.88 m), weight 88.4 kg (194 lb 14.2 oz), SpO2 100 %.  Intake/Output Summary (Last 24 hours) at 10/11/14 1527 Last data filed at 10/11/14 1300  Gross  per 24 hour  Intake   3040 ml  Output   2125 ml  Net    915 ml   Exam: General: No acute respiratory distress Lungs: Clear to auscultation bilaterally without wheezes or crackles Cardiovascular: tachycardic but regular - no gallup, rub, or M Abdomen: Nontender, nondistended, soft, bowel sounds positive, no rebound, no ascites, no appreciable mass Extremities: No significant cyanosis, clubbing, or edema bilateral lower extremities  Data Reviewed: Basic Metabolic Panel:  Recent Labs Lab 10/09/14 2119 10/10/14 0432 10/11/14 0758  NA 137 138 139  K 4.7 4.1 3.9  CL 107 110 106  CO2 24 22 26   GLUCOSE 90 83 87  BUN 37* 20 9  CREATININE 0.82 0.88 1.01  CALCIUM 8.7 8.2* 8.5   Liver Function Tests:  Recent Labs Lab 10/09/14 2119 10/10/14 0432  AST 18 21  ALT 18 17  ALKPHOS 63 57  BILITOT 0.6 0.9  PROT 6.5 5.5*  ALBUMIN 4.0 3.2*    Recent Labs Lab 10/09/14 2307  LIPASE <10*   CBC:  Recent Labs Lab 10/09/14 2119 10/10/14 0432 10/10/14 1105 10/10/14 1800 10/11/14 0758  WBC 9.7  --   --   --  5.6  HGB 12.5* 11.2* 10.4* 10.9* 10.3*  HCT 37.9* 33.5* 31.8* 33.2* 31.8*  MCV 86.1  --   --   --  84.6  PLT 317  --   --   --  300   Cardiac Enzymes:  Recent Labs Lab 10/09/14 2307 10/10/14  1448 10/10/14 0432 10/10/14 0920  TROPONINI <0.03 <0.03 <0.03 <0.03    Recent Results (from the past 240 hour(s))  MRSA PCR Screening     Status: None   Collection Time: 10/10/14  4:00 AM  Result Value Ref Range Status   MRSA by PCR NEGATIVE NEGATIVE Final    Comment:        The GeneXpert MRSA Assay (FDA approved for NASAL specimens only), is one component of a comprehensive MRSA colonization surveillance program. It is not intended to diagnose MRSA infection nor to guide or monitor treatment for MRSA infections.      Studies:  Recent x-ray studies have been reviewed in detail by the Attending Physician  Scheduled Meds:  Scheduled Meds: . [START ON  10/13/2014] pantoprazole (PROTONIX) IV  40 mg Intravenous Q12H  . simvastatin  40 mg Oral QHS    Time spent on care of this patient: 35 mins  Cherene Altes, MD [facility] For Consults/Admissions - Flow Manager - [phone] Office  [phone]  Contact MD directly via text page:      [email or web address]      password [facility]  10/11/2014, 3:27 PM   LOS: 2 days

## 2014-10-11 NOTE — H&P (View-Only) (Signed)
[facility] Consult: 1:12 PM 10/10/2014  LOS: 1 day    Referring Provider:  Dia Crawford, MD Primary Care Physician:  Quentin Cornwall, MD ion [facility] Primary Gastroenterologist:  Dr. Barney Drain in [place]    Reason for Consultation:  GI bleed    HPI: Ethan Hunter is a 54 y.o. male.  Resident of [place], [place].   history kidney stones and high cholesterol.  Yesterday evening he developed dark stools with cramping abdominal pain. He was diaphoretic. If these at home. He had syncopal spells. He was seen in the ED at [facility] and sent to [facility] hospital for admission.   initial hemoglobin 12.5 with normal MCV. Today the hemoglobin has dropped to 10.4. He has not required blood transfusion.  BUN was elevated at 37  Patient had screening colonoscopy in December 2014 and results are noted below. He has never undergone upper endoscopy. He does not use aspirin or NSAID products.  He doesn't consume alcoholic beverages and has never smoked cigarettes. His wife says that he belches a lot but this causes him no other symptoms. ID he denies heartburn, dysphagia, nausea. Due to death of his older brother on October 11, 2022 as well as of his pastor on January 13, the patient has not been eating as much as usual and he's lost about 12 pounds in the last several weeks. Patient had no chest pain. No shortness of breath. He had a abdominal/pelvic CT scan this morning which showed no worrisome intestinal, gastric or biliary issues. A head CT was also reassuring with negative intracranial imaging but did show chronic left maxillary sinusitis obstruction.    Past Medical History  Diagnosis Date  . Hypercholesteremia   . Arthritis     Past Surgical History  Procedure Laterality Date  . Retinal detachment surgery Bilateral   .  Cataract extraction, bilateral Bilateral   . Cystoscopy/retrograde/ureteroscopy/stone extraction with basket    . Colonoscopy N/A 08/22/2013    Procedure: COLONOSCOPY;  Surgeon: Danie Binder, MD;  Location: [facility]ITE;  Service: Endoscopy;  Laterality: N/A;  8:30 AM    Prior to Admission medications   Medication Sig Start Date End Date Taking? Authorizing Provider  fluticasone (FLONASE) 50 MCG/ACT nasal spray Place 1 spray into both nostrils daily as needed for allergies or rhinitis.   Yes Historical Provider, MD  simvastatin (ZOCOR) 40 MG tablet Take 40 mg by mouth at bedtime.   Yes Historical Provider, MD  Sod Picosulfate-Mag Ox-Cit Acd 10-3.5-12 MG-GM-GM PACK Take 1 Container by mouth as directed. Patient not taking: Reported on 10/09/2014 08/02/13   Danie Binder, MD    Scheduled Meds: . [START ON 10/13/2014] pantoprazole (PROTONIX) IV  40 mg Intravenous Q12H  . simvastatin  40 mg Oral QHS   Infusions: . sodium chloride    . pantoprozole (PROTONIX) infusion 8 mg/hr (10/10/14 0938)   PRN Meds: acetaminophen **OR** acetaminophen, morphine injection, ondansetron **OR** ondansetron (ZOFRAN) IV   Allergies as of 10/09/2014  . (No Known Allergies)    History reviewed. No pertinent family history.  History   Social History  . Marital Status: Married    Spouse Name: N/A    Number of Children: N/A  . Years of Education: N/A   Occupational History  . Forklift operator     [employer]   Social History Main Topics  . Smoking status: Never Smoker   . Smokeless tobacco: Not on file  . Alcohol Use: No  . Drug Use: No  . Sexual Activity: Yes    Birth Control/ Protection: None   Other Topics Concern  . Not on file   Social History Narrative    REVIEW OF SYSTEMS: Constitutional:   generally no activity limitations. He is able to work a full-time job. ENT:  No nose bleeds Pulm:   no dyspnea, no cough. CV:  No palpitations, no LE edema.  GU:  No hematuria, no frequency GI:    perHPI Heme:  Per HPI   Transfusions:  None ever Neuro:  No headaches, no peripheral tingling or numbness Derm:  No itching, no rash or sores.  Endocrine:  No sweats or chills.  No polyuria or dysuria Immunization:  Not queried.  Travel:   has been traveling back and forth to [facility] in recent months because that is where his brother was living.    PHYSICAL EXAM: Vital signs in last 24 hours: Filed Vitals:   10/10/14 1135  BP: 127/75  Pulse: 89  Temp: 98.1 F (36.7 C)  Resp: 14   Wt Readings from Last 3 Encounters:  10/10/14 188 lb 3.2 oz (85.367 kg)  04/11/13 202 lb (91.627 kg)    General:  pleasant, healthy-appearing, alert. Comfortable Head:   no signs of trauma, no facial edema, no facial asymmetry.  Eyes:   no icterus, no conjunctival pallor Ear: No hearing deficit  Nose:   no congestion, no discharge Mouth:   clear, pink and moist oral mucous membranes. Dentition in good repair NecNo JVD, no bruits, no TMG, no mass Lung:  CTA bil.  No cough or SOB Heart: RRR no MRG. abdomen:  Soft, NT, ND.  no HSM. No mass. No bruit. No hernias   Rectal: Deferred. Did see smart phone based photo which shows a very dark stool leaching red blood into the commode water.    Musc/Skeltl: No joint swelling, contractures or other deformities.  Extremities:  No CCE. Neurologic:  Oriented 3. No limb weakness. No tremors. No gross deficits. Skin:  no telangiectasia, no rash.  Small laceration near the right elbow Tattoos:None seen Nodes: No cervical adenopathy. Psych: pleasant, relaxed, in good spirits.  Intake/Output from previous day: 01/18 0701 - 01/19 0700 In: 600 [I.V.:600] Out: -  Intake/Output this shift: Total I/O In: 600 [I.V.:600] Out: 600 [Urine:600]  LAB RESULTS:  Recent Labs  10/09/14 2119 10/10/14 0432 10/10/14 1105  WBC 9.7  --   --   HGB 12.5* 11.2* 10.4*  HCT 37.9* 33.5* 31.8*  PLT 317  --   --    BMET Lab Results  Component Value Date     NA 138 10/10/2014   NA 137 10/09/2014   NA 139 10/31/2010   K 4.1 10/10/2014   K 4.7 10/09/2014   K 3.5 10/31/2010   CL 110 10/10/2014   CL 107 10/09/2014   CL 103 10/31/2010   CO2 22 10/10/2014   CO2 24 10/09/2014   CO2 28 10/31/2010   GLUCOSE 83 10/10/2014   GLUCOSE 90 10/09/2014   GLUCOSE 100* 10/31/2010   BUN 20 10/10/2014  BUN 37* 10/09/2014   BUN 15 10/31/2010   CREATININE 0.88 10/10/2014   CREATININE 0.82 10/09/2014   CREATININE 1.32 10/31/2010   CALCIUM 8.2* 10/10/2014   CALCIUM 8.7 10/09/2014   CALCIUM 8.8 10/31/2010   LFT  Recent Labs  10/09/14 2119 10/10/14 0432  PROT 6.5 5.5*  ALBUMIN 4.0 3.2*  AST 18 21  ALT 18 17  ALKPHOS 63 57  BILITOT 0.6 0.9   PT/INR Lab Results  Component Value Date   INR 0.97 10/09/2014   Lipase     Component Value Date/Time   LIPASE <10* 10/09/2014 2307    Drugs of Abuse  No results found for: LABOPIA, COCAINSCRNUR, LABBENZ, AMPHETMU, THCU, LABBARB   RADIOLOGY STUDIES: Ct Head Wo Contrast  10/10/2014   CLINICAL DATA:  Syncope in the setting of GI bleeding.  EXAM: CT HEAD WITHOUT CONTRAST  TECHNIQUE: Contiguous axial images were obtained from the base of the skull through the vertex without intravenous contrast.  COMPARISON:  None.  FINDINGS: Skull and Sinuses:The completely opacified left maxillary sinus (note the lower portion is not visualized) is atelectatic. These are the imaging findings of silent sinus syndrome.  Orbits: Bilateral cataract resection and left scleral banding. No acute findings.  Brain: No evidence of acute infarction, hemorrhage, hydrocephalus, or mass lesion/mass effect.  IMPRESSION: 1. Negative intracranial imaging. 2. Chronically obstructed left maxillary sinus.   Electronically Signed   By: Jorje Guild M.D.   On: 10/10/2014 00:42   Ct Abdomen Pelvis W Contrast  10/10/2014   CLINICAL DATA:  Pt. Is complaining of GI bleeding that started at [facility] this evening. Pt reports associated constant,  lower abdominal pain that started around 4PM today. He reports having 3 bloody stools since onset. Pt says his stools have been loose and black in color. His wife says that after the first time he had a bloody stool she saw him pass out and fall in the bathroom. She says that pt hit his head on the bathroom wall  EXAM: CT ABDOMEN AND PELVIS WITH CONTRAST  TECHNIQUE: Multidetector CT imaging of the abdomen and pelvis was performed using the standard protocol following bolus administration of intravenous contrast.  CONTRAST:  53mL OMNIPAQUE IOHEXOL 300 MG/ML SOLN, 114mL OMNIPAQUE IOHEXOL 300 MG/ML SOLN  COMPARISON:  10/31/2010  FINDINGS: Air-fluid levels are noted in a nondistended colon. There is no colonic wall thickening or mesenteric inflammation. Small bowel is unremarkable. Appendix not visualized. No acute appendicitis.  Clear lung bases.  Heart normal in size.  Small hiatal hernia.  Liver, spleen, gallbladder, pancreas, adrenal glands:  Normal.  Bilateral nonobstructing intrarenal stones. No renal masses. No hydronephrosis. Normal ureters. Normal bladder.  No adenopathy.  No ascites.  Arthropathic changes of the hips, right greater than left. Minor degenerative changes of the visualized spine. No osteoblastic or osteolytic lesions.  IMPRESSION: 1. Air-fluid levels noted in an otherwise unremarkable colon. No bowel mass or inflammatory change. Cause of the GI bleeding is unclear. 2. Normal small bowel. 3. Nonobstructing bilateral intrarenal stones. No ureteral stone or obstructive uropathy. 4. Arthropathic changes of the hips, right greater than left. 5. Minor degenerative changes of the visualized spine. No other abnormalities.   Electronically Signed   By: Lajean Manes M.D.   On: 10/10/2014 00:43    ENDOSCOPIC STUDIES: 08/2013 screening colonoscopy with Dr. Barney Drain. Medium sized internal hemorrhoids. Removal of transverse colon polyp. Pathology on this showed fecal/vegetable  material.   IMPRESSION:   *  Upper GI bleed.  With associated syncope.  Rule out ulcer.  *  ABL anemia. Has not required transfusions thus far.  *  Non-obstructing renal stones    PLAN:     *  Arrange for upper endoscopy tomorrow mid day.  For now we will leave the IV Protonix drip in place.  *  Can have clears today.    Azucena Freed  10/10/2014, 1:12 PM Pager: [phone]  GI ATTENDING  History, laboratories, prior colonoscopy report reviewed. Patient personally seen and examined. Family members in room. Agree with H&P as outlined above. Patient presents with acute upper GI bleed and syncope. Now stable without bowel movement since admission. Agree with volume resuscitation, IV PPI, and plans for upper endoscopy.The nature of the procedure, as well as the risks, benefits, and alternatives were carefully and thoroughly reviewed with the patient. Ample time for discussion and questions allowed. The patient understood, was satisfied, and agreed to proceed.. As the patient is stable, endoscopy scheduled for a.m. Sooner if needed should hemodynamic instability develop.  Docia Chuck. Geri Seminole., M.D. [facility] Division of Gastroenterology

## 2014-10-11 NOTE — Transfer of Care (Signed)
Immediate Anesthesia Transfer of Care Note  Patient: Ethan Hunter  Procedure(s) Performed: Procedure(s): ESOPHAGOGASTRODUODENOSCOPY (EGD) (N/A)  Patient Location: PACU  Anesthesia Type:MAC  Level of Consciousness: awake and alert   Airway & Oxygen Therapy: Patient Spontanous Breathing and Patient connected to nasal cannula oxygen  Post-op Assessment: Report given to PACU RN, Post -op Vital signs reviewed and stable and Patient moving all extremities  Post vital signs: Reviewed and stable  Complications: No apparent anesthesia complications

## 2014-10-11 NOTE — Anesthesia Postprocedure Evaluation (Signed)
  Anesthesia Post-op Note  Patient: Ethan Hunter  Procedure(s) Performed: Procedure(s): ESOPHAGOGASTRODUODENOSCOPY (EGD) (N/A)  Patient Location: PACU  Anesthesia Type:MAC  Level of Consciousness: awake, oriented, sedated and patient cooperative  Airway and Oxygen Therapy: Patient Spontanous Breathing  Post-op Pain: none  Post-op Assessment: Post-op Vital signs reviewed, Patient's Cardiovascular Status Stable, Respiratory Function Stable, Patent Airway, No signs of Nausea or vomiting and Pain level controlled  Post-op Vital Signs: stable  Last Vitals:  Filed Vitals:   10/11/14 1147  BP: 140/73  Pulse: 80  Temp: 36.6 C  Resp: 9    Complications: No apparent anesthesia complications

## 2014-10-11 NOTE — Op Note (Signed)
[facility] [place], [id number]   ENDOSCOPY PROCEDURE REPORT  PATIENT: Ethan, Hunter  MR#: [id number] BIRTHDATE: 02/19/1961 , 53  yrs. old GENDER: male ENDOSCOPIST: Eustace Quail, MD REFERRED BY:  [facility] PROCEDURE DATE:  10/11/2014 PROCEDURE:  EGD w/ biopsy ASA CLASS:     Class II INDICATIONS:  melena. MEDICATIONS: Monitored anesthesia care and Per Anesthesia TOPICAL ANESTHETIC: Cetacaine Spray  DESCRIPTION OF PROCEDURE: After the risks benefits and alternatives of the procedure were thoroughly explained, informed consent was obtained.  The EG-[id number] ([id number]) endoscope was introduced through the mouth and advanced to the second portion of the duodenum , Without limitations.  The instrument was slowly withdrawn as the mucosa was fully examined.   EXAM:The esophagus revealed multiple benign concentric rings throughout.  The stomach revealed patchy erythema in the antrum and body junction.  The duodenal bulb revealed a 1.5 cm, somewhat deep, clean-based ulcer without active bleeding or stigmata.  The post bulbar duodenum was normal.  CLO biopsy taken.  Retroflexed views revealed a hiatal hernia.     The scope was then withdrawn from the patient and the procedure completed.  COMPLICATIONS: There were no immediate complications.  ENDOSCOPIC IMPRESSION: 1. Acute duodenal ulcer without active bleeding or stigmata 2. Otherwise unremarkable EGD  RECOMMENDATIONS: 1.  Pantoprazole 40 mg twice a day -8 weeks 2.  Avoid NSAIDS . If NSAIDs necessary, could administer PPI 3.  Rx CLO if positive 4. Resume diet. Okay to discharge home later today or in a.m. Discussed with the stool and family. Will sign off. Thank you REPEAT EXAM:  eSigned:  Eustace Quail, MD 10/11/2014 11:47 AM    CC:The Patient

## 2014-10-11 NOTE — Anesthesia Preprocedure Evaluation (Signed)
Anesthesia Evaluation  Patient identified by MRN, date of birth, ID band Patient awake    Reviewed: Allergy & Precautions, NPO status , Patient's Chart, lab work & pertinent test results  Airway        Dental   Pulmonary          Cardiovascular     Neuro/Psych    GI/Hepatic   Endo/Other    Renal/GU Renal disease     Musculoskeletal  (+) Arthritis -,   Abdominal   Peds  Hematology   Anesthesia Other Findings   Reproductive/Obstetrics                             Anesthesia Physical Anesthesia Plan  ASA: I  Anesthesia Plan: MAC   Post-op Pain Management:    Induction: Intravenous  Airway Management Planned: Mask  Additional Equipment:   Intra-op Plan:   Post-operative Plan:   Informed Consent: I have reviewed the patients History and Physical, chart, labs and discussed the procedure including the risks, benefits and alternatives for the proposed anesthesia with the patient or authorized representative who has indicated his/her understanding and acceptance.     Plan Discussed with: CRNA, Anesthesiologist and Surgeon  Anesthesia Plan Comments:         Anesthesia Quick Evaluation

## 2014-10-11 NOTE — Interval H&P Note (Signed)
History and Physical Interval Note:  10/11/2014 11:15 AM  Ethan Hunter  has presented today for surgery, with the diagnosis of Dark, bloody stools. Anemia.  The various methods of treatment have been discussed with the patient and family. After consideration of risks, benefits and other options for treatment, the patient has consented to  Procedure(s): ESOPHAGOGASTRODUODENOSCOPY (EGD) (N/A) as a surgical intervention .  The patient's history has been reviewed, patient examined, no change in status, stable for surgery.  I have reviewed the patient's chart and labs.  Questions were answered to the patient's satisfaction.     Scarlette Shorts

## 2014-10-12 ENCOUNTER — Encounter (HOSPITAL_COMMUNITY): Payer: Self-pay | Admitting: Internal Medicine

## 2014-10-12 DIAGNOSIS — K264 Chronic or unspecified duodenal ulcer with hemorrhage: Secondary | ICD-10-CM

## 2014-10-12 DIAGNOSIS — R55 Syncope and collapse: Secondary | ICD-10-CM | POA: Insufficient documentation

## 2014-10-12 LAB — CBC
HCT: 29.3 % — ABNORMAL LOW (ref 39.0–52.0)
Hemoglobin: 9.8 g/dL — ABNORMAL LOW (ref 13.0–17.0)
MCH: 28 pg (ref 26.0–34.0)
MCHC: 33.4 g/dL (ref 30.0–36.0)
MCV: 83.7 fL (ref 78.0–100.0)
PLATELETS: 291 10*3/uL (ref 150–400)
RBC: 3.5 MIL/uL — ABNORMAL LOW (ref 4.22–5.81)
RDW: 14.1 % (ref 11.5–15.5)
WBC: 5.9 10*3/uL (ref 4.0–10.5)

## 2014-10-12 MED ORDER — PANTOPRAZOLE SODIUM 40 MG PO TBEC: 40.0000 mg | DELAYED_RELEASE_TABLET | Freq: Two times a day (BID) | ORAL | Status: DC

## 2014-10-12 NOTE — Progress Notes (Signed)
Discharge teaching performed. Patient expressed understanding verbally and signed discharge paperwork.  IVs were removed and patient instructed to call for someone to pick him up.  Patient is stable at this time.  Will continue to monitor.

## 2014-10-12 NOTE — Discharge Summary (Signed)
Physician Discharge Summary  Ethan Hunter PYK:[id number] DOB: 04-02-1961 DOA: 10/09/2014  PCP: Quentin Cornwall, MD  Admit date: 10/09/2014 Discharge date: 10/12/2014  Time spent: 35 minutes  Recommendations for Outpatient Follow-up:  Syncope with collapse -Likely due to GI bleeding, will continue IV fluids. Patient's hemoglobin is 12.5, his previous low globin was 15.1 in February 2012. -Continue to monitor hemoglobin -PCP to monitor  Lower GI bleed -Cause, colonoscopy in 2014 revealed moderate internal hemorrhoids as well as polyp which was removed. -EGD showed gastric ulcer without bleeding. -Discharge on Protonix 40 mg BID for minimum of 8 weeks -Avoid NSAIDs -Follow-up with PCP  ST elevation -Patient does not have chest pain. -Troponin 3 negative -Patient's EKG shows ST elevation in the inferior and lateral leads. -Cardiology was consulted and they recommended patient to transfer to [facility] stepdown -Cardiology recommends no further workup  -Follow-up with PCP      Discharge Diagnoses:  Active Problems:   Syncope   Lower GI bleeding   ST elevation   Gastrointestinal hemorrhage with melena   Duodenal ulcer hemorrhagic   Discharge Condition: Stable  Diet recommendation: Regular  Filed Weights   10/09/14 2050 10/10/14 0340 10/11/14 0419  Weight: 90.719 kg (200 lb) 85.367 kg (188 lb 3.2 oz) 88.4 kg (194 lb 14.2 oz)    History of present illness:  54 year old BM PMHx Hypercholesteremia and Arthritis. Today presents to the ED after patient had 3 bloody stools this afternoon. Patient complained of lower abdominal pain was started on 4 PM and after that he had bloody bowel movement and then passed out and fell in the bathroom. Patient's wife held the patient and was moving into the bedroom when he again passed out. As per the wife patient as her only for a couple of minutes. Patient was brought to the ED for further evaluation. Patient had colonoscopy in  2014, at that time he was found to have moderate-sized internal hemorrhoids as well as a polyp was removed. In the ED patient was found to be hypotensive, with positive orthostatic hypotension. Also had mild ST elevation in the inferior leads 2,3 aVF as well as leads V5 and V6. Cardiology Dr. Alvester Chou on call was consulted by the ED physician Dr. Wyvonnia Dusky, cardiologist reviewed the EKG and recommended transfer to [facility] stepdown. No plans for taking for cath lab at this time as patient did not have chest pain, and also has lower GI bleed. Patient denies chest pain but had sweating, also complain of nausea but no vomiting. Patient says that he has ongoing lower abdominal pain. CT abdomen pelvis no significant abnormality to explain GI bleeding. During patient's hospitalization obtain EGD which showed gastric ulcer without bleeding. Patient was up and ambulated around ward without symptoms. Stable for discharge   Consultants: Dr. Cletus Gash (cardiology) Dr. Scarlette Shorts (GI)   Procedure/Significant Events: 1/18 CT abdomen pelvis with contrast;-air-fluid levels noted in an otherwise unremarkable colon. -Cause of the GI bleeding is unclear. - Nonobstructing bilateral intrarenal stones. No ureteral stone or obstructive uropathy. EGD 1/20 - acute duodenal ulcer without active bleeding       Discharge Exam: Filed Vitals:   10/12/14 0450 10/12/14 0757 10/12/14 0758 10/12/14 0759  BP:  117/68 120/69 116/77  Pulse:  76 97 94  Temp: 98.4 F (36.9 C) 97.9 F (36.6 C)    TempSrc: Oral Oral    Resp:  23 23 22   Height:      Weight:  SpO2:        General: A/O 4, NAD, No acute respiratory distress Lungs: Clear to auscultation bilaterally without wheezes or crackles Cardiovascular: Regular rate and rhythm without murmur gallop or rub normal S1 and S2 Abdomen: Nontender, nondistended, soft, bowel sounds positive, no rebound, no ascites, no appreciable mass Extremities: No significant  cyanosis, clubbing, or edema bilateral lower extremities   Discharge Instructions     Medication List    ASK your doctor about these medications        fluticasone 50 MCG/ACT nasal spray  Commonly known as:  FLONASE  Place 1 spray into both nostrils daily as needed for allergies or rhinitis.     simvastatin 40 MG tablet  Commonly known as:  ZOCOR  Take 40 mg by mouth at bedtime.     Sod Picosulfate-Mag Ox-Cit Acd 10-3.5-12 MG-GM-GM Pack  Take 1 Container by mouth as directed.       No Known Allergies    The results of significant diagnostics from this hospitalization (including imaging, microbiology, ancillary and laboratory) are listed below for reference.    Significant Diagnostic Studies: Ct Head Wo Contrast  10/10/2014   CLINICAL DATA:  Syncope in the setting of GI bleeding.  EXAM: CT HEAD WITHOUT CONTRAST  TECHNIQUE: Contiguous axial images were obtained from the base of the skull through the vertex without intravenous contrast.  COMPARISON:  None.  FINDINGS: Skull and Sinuses:The completely opacified left maxillary sinus (note the lower portion is not visualized) is atelectatic. These are the imaging findings of silent sinus syndrome.  Orbits: Bilateral cataract resection and left scleral banding. No acute findings.  Brain: No evidence of acute infarction, hemorrhage, hydrocephalus, or mass lesion/mass effect.  IMPRESSION: 1. Negative intracranial imaging. 2. Chronically obstructed left maxillary sinus.   Electronically Signed   By: Jorje Guild M.D.   On: 10/10/2014 00:42   Ct Abdomen Pelvis W Contrast  10/10/2014   CLINICAL DATA:  Pt. Is complaining of GI bleeding that started at [facility] this evening. Pt reports associated constant, lower abdominal pain that started around 4PM today. He reports having 3 bloody stools since onset. Pt says his stools have been loose and black in color. His wife says that after the first time he had a bloody stool she saw him pass out and fall  in the bathroom. She says that pt hit his head on the bathroom wall  EXAM: CT ABDOMEN AND PELVIS WITH CONTRAST  TECHNIQUE: Multidetector CT imaging of the abdomen and pelvis was performed using the standard protocol following bolus administration of intravenous contrast.  CONTRAST:  18mL OMNIPAQUE IOHEXOL 300 MG/ML SOLN, 160mL OMNIPAQUE IOHEXOL 300 MG/ML SOLN  COMPARISON:  10/31/2010  FINDINGS: Air-fluid levels are noted in a nondistended colon. There is no colonic wall thickening or mesenteric inflammation. Small bowel is unremarkable. Appendix not visualized. No acute appendicitis.  Clear lung bases.  Heart normal in size.  Small hiatal hernia.  Liver, spleen, gallbladder, pancreas, adrenal glands:  Normal.  Bilateral nonobstructing intrarenal stones. No renal masses. No hydronephrosis. Normal ureters. Normal bladder.  No adenopathy.  No ascites.  Arthropathic changes of the hips, right greater than left. Minor degenerative changes of the visualized spine. No osteoblastic or osteolytic lesions.  IMPRESSION: 1. Air-fluid levels noted in an otherwise unremarkable colon. No bowel mass or inflammatory change. Cause of the GI bleeding is unclear. 2. Normal small bowel. 3. Nonobstructing bilateral intrarenal stones. No ureteral stone or obstructive uropathy. 4. Arthropathic changes of the hips,  right greater than left. 5. Minor degenerative changes of the visualized spine. No other abnormalities.   Electronically Signed   By: Lajean Manes M.D.   On: 10/10/2014 00:43    Microbiology: Recent Results (from the past 240 hour(s))  MRSA PCR Screening     Status: None   Collection Time: 10/10/14  4:00 AM  Result Value Ref Range Status   MRSA by PCR NEGATIVE NEGATIVE Final    Comment:        The GeneXpert MRSA Assay (FDA approved for NASAL specimens only), is one component of a comprehensive MRSA colonization surveillance program. It is not intended to diagnose MRSA infection nor to guide or monitor treatment  for MRSA infections.      Labs: Basic Metabolic Panel:  Recent Labs Lab 10/09/14 2119 10/10/14 0432 10/11/14 0758  NA 137 138 139  K 4.7 4.1 3.9  CL 107 110 106  CO2 24 22 26   GLUCOSE 90 83 87  BUN 37* 20 9  CREATININE 0.82 0.88 1.01  CALCIUM 8.7 8.2* 8.5   Liver Function Tests:  Recent Labs Lab 10/09/14 2119 10/10/14 0432  AST 18 21  ALT 18 17  ALKPHOS 63 57  BILITOT 0.6 0.9  PROT 6.5 5.5*  ALBUMIN 4.0 3.2*    Recent Labs Lab 10/09/14 2307  LIPASE <10*   No results for input(s): AMMONIA in the last 168 hours. CBC:  Recent Labs Lab 10/09/14 2119 10/10/14 0432 10/10/14 1105 10/10/14 1800 10/11/14 0758 10/12/14 0302  WBC 9.7  --   --   --  5.6 5.9  HGB 12.5* 11.2* 10.4* 10.9* 10.3* 9.8*  HCT 37.9* 33.5* 31.8* 33.2* 31.8* 29.3*  MCV 86.1  --   --   --  84.6 83.7  PLT 317  --   --   --  300 291   Cardiac Enzymes:  Recent Labs Lab 10/09/14 2307 10/10/14 0146 10/10/14 0432 10/10/14 0920  TROPONINI <0.03 <0.03 <0.03 <0.03   BNP: BNP (last 3 results) No results for input(s): PROBNP in the last 8760 hours. CBG: No results for input(s): GLUCAP in the last 168 hours.     Signed:  Dia Crawford, MD [facility] [phone] pager

## 2014-10-13 LAB — CLOTEST (H. PYLORI), BIOPSY: HELICOBACTER SCREEN: NEGATIVE

## 2014-10-17 ENCOUNTER — Telehealth: Payer: Self-pay | Admitting: Internal Medicine

## 2014-10-25 NOTE — Telephone Encounter (Signed)
Spoke with pharmacy who said that patient's pantoprazole did not need a prior authorization, they just tried to refill it too soon.  Can't be refilled until 11/08/2014.  Spoke with patient's wife and relayed this information.  She acknowledged and understood

## 2014-11-13 ENCOUNTER — Telehealth: Payer: Self-pay | Admitting: Internal Medicine

## 2014-11-13 NOTE — Telephone Encounter (Signed)
Wife states pt is having hip replacement surgery and the surgeon wants him to come home on [place]. Wife states Dr. Henrene Pastor had discussed with them that the pt should not take certain blood thinners. Wife wants to know if Dr. Henrene Pastor thinks the Lennette Bihari will be ok for him to take. Please advise.

## 2014-11-13 NOTE — Telephone Encounter (Signed)
Spoke with pts wife and she is aware. 

## 2014-11-13 NOTE — Telephone Encounter (Signed)
Should be okay as long as he stays on his PPI twice daily

## 2014-11-15 ENCOUNTER — Telehealth: Payer: Self-pay | Admitting: Internal Medicine

## 2014-11-16 NOTE — Telephone Encounter (Signed)
Per telephone note from 11/13/14 per Dr. Henrene Pastor ok for pt to take xerelto. Dr. Daralene Milch office wants a letter from Dr. Henrene Pastor stating it is ok for pt to take xerelto after hip replacement surgery. Letter needs to be faxed to [phone]. Pt is scheduled for surgery March 8th.

## 2014-11-21 NOTE — Telephone Encounter (Signed)
Letter drafted and sent to Dr. Daralene Milch office.

## 2014-11-21 NOTE — Telephone Encounter (Signed)
Vaughan Basta, graft a letter saying that "it is okay for the patient to be on short-term postoperative Xarelto, presumably for the prevention of postoperative blood clots, as long as he is on twice daily PPI preoperatively, during hospitalization, and postoperatively. As well, the patient should avoid NSAIDs as well as aspirin (unless there is an overwhelming cardioprotective reason for its use). Discontinue xeralto soon as the patient is out of the blood clot risk window"

## 2015-12-01 IMAGING — CT CT ABD-PELV W/ CM
2 of 4 series · 15 of 46 positions shown, 17 images · IV contrast (Omnipaque 300)
Comparison: 10/31/2010

CLINICAL DATA: Pt. Is complaining of GI bleeding that started at
6PM this evening. Pt reports associated constant, lower abdominal
pain that started around 4PM today. He reports having 3 bloody
stools since onset. Pt says his stools have been loose and black in
color. His wife says that after the first time he had a bloody stool
she saw him pass out and fall in the bathroom. She says that pt hit
his head on the bathroom wall

EXAM:
CT ABDOMEN AND PELVIS WITH CONTRAST
TECHNIQUE: Multidetector CT imaging of the abdomen and pelvis was performed
using the standard protocol following bolus administration of
intravenous contrast.
CONTRAST:  50mL OMNIPAQUE IOHEXOL 300 MG/ML SOLN, 100mL OMNIPAQUE
IOHEXOL 300 MG/ML SOLN

[Series 2: abd_pel_with 5.0 b40f · axial · 0.74mm/px · z∈[-518,-88]mm · 12 of 96 slices shown, 14 images]
[im 5/96  soft-tissue]
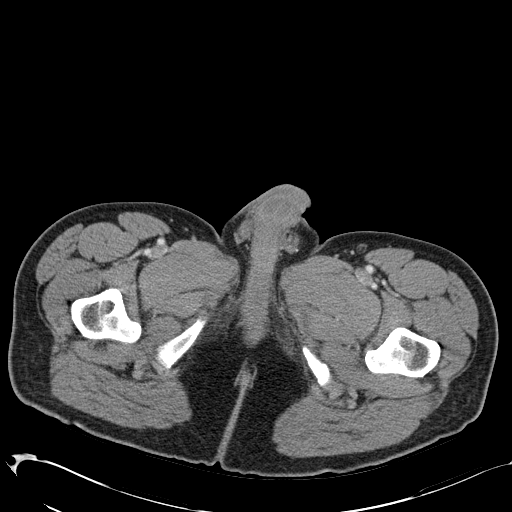
[im 5/96  bone]
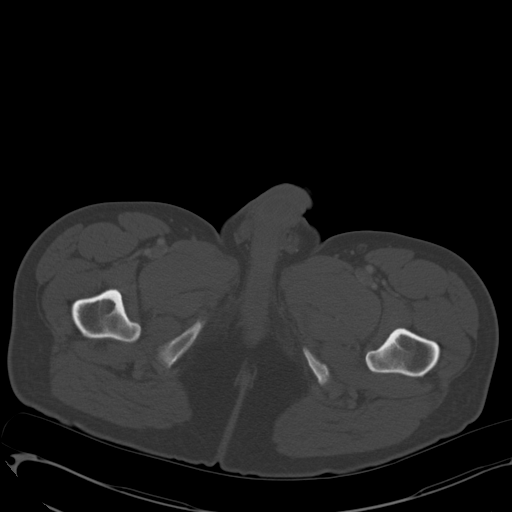
[im 14/96  soft-tissue]
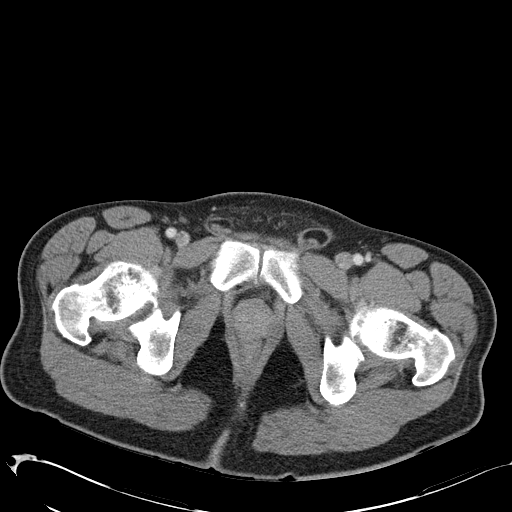
[im 23/96  soft-tissue]
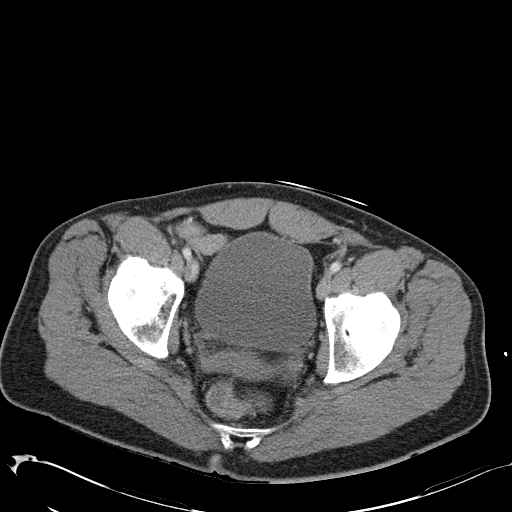
[im 28/96  soft-tissue]
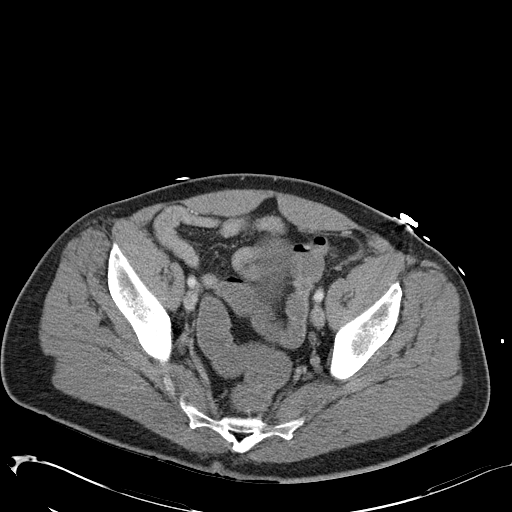
[im 37/96  soft-tissue]
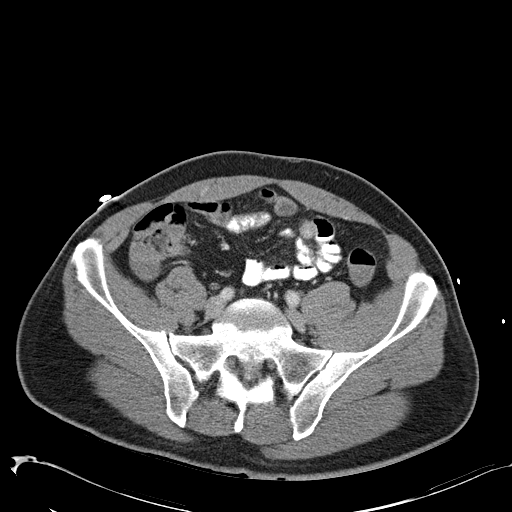
[im 46/96  soft-tissue]
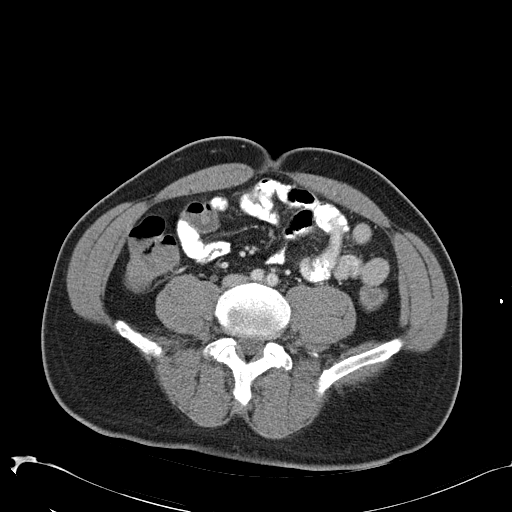
[im 50/96  soft-tissue]
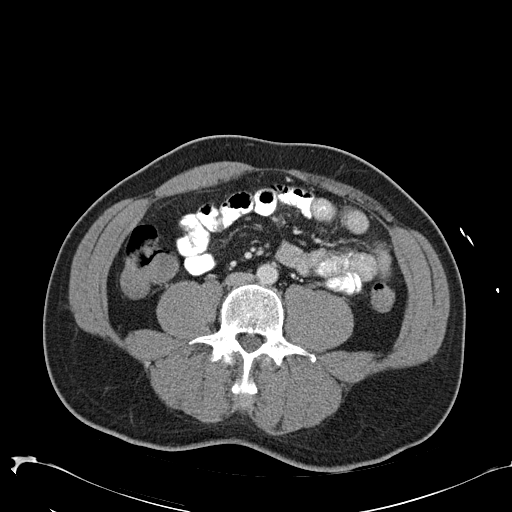
[im 59/96  soft-tissue]
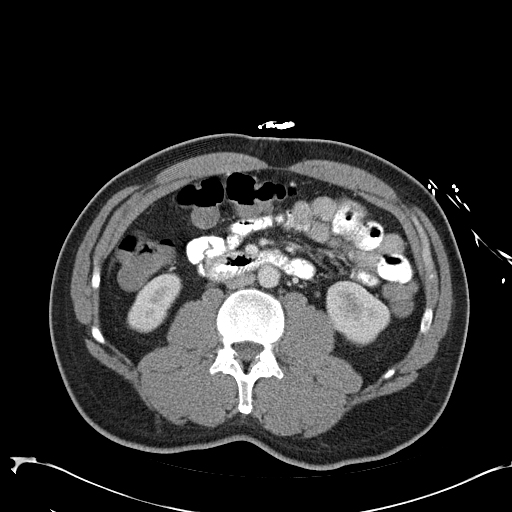
[im 68/96  soft-tissue]
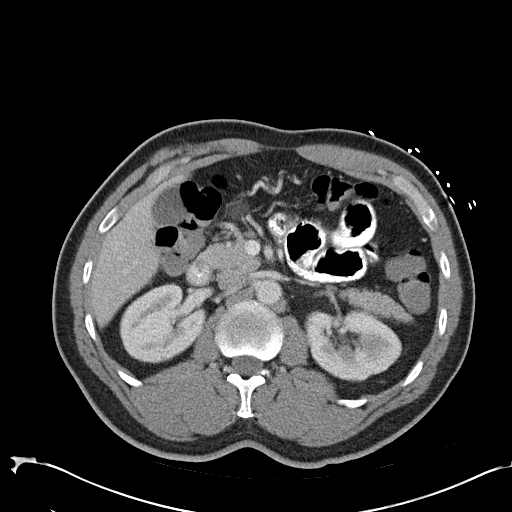
[im 68/96  bone]
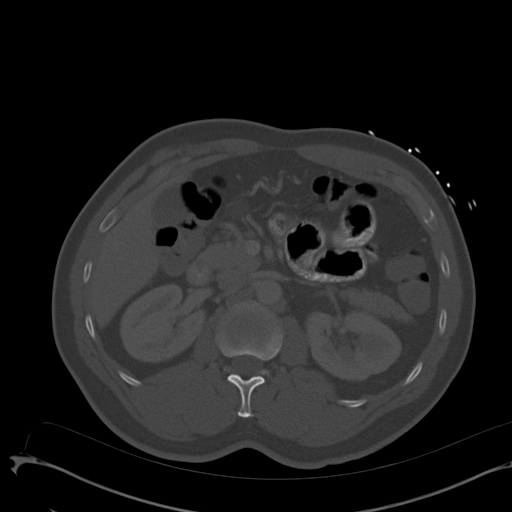
[im 73/96  soft-tissue]
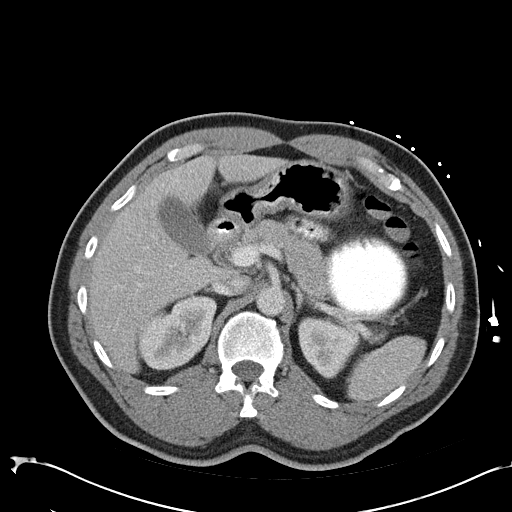
[im 82/96  soft-tissue]
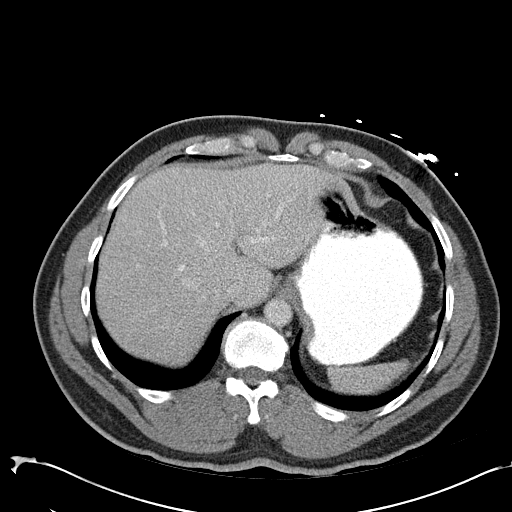
[im 91/96  soft-tissue]
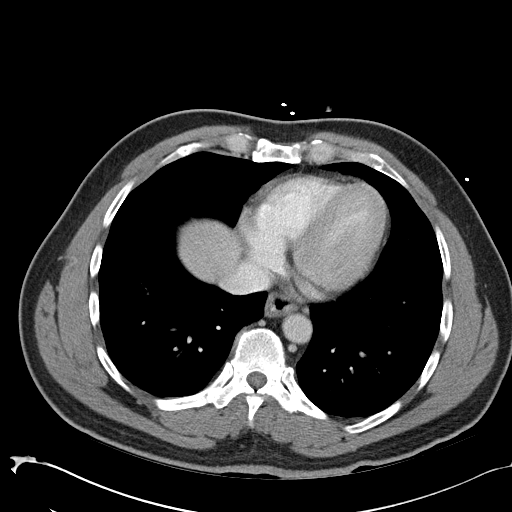

[Series 3: abd_pel_with 3.0 spo cor · coronal · 0.63mm/px · 3 of 71 slices shown]
[im 24/71  soft-tissue]
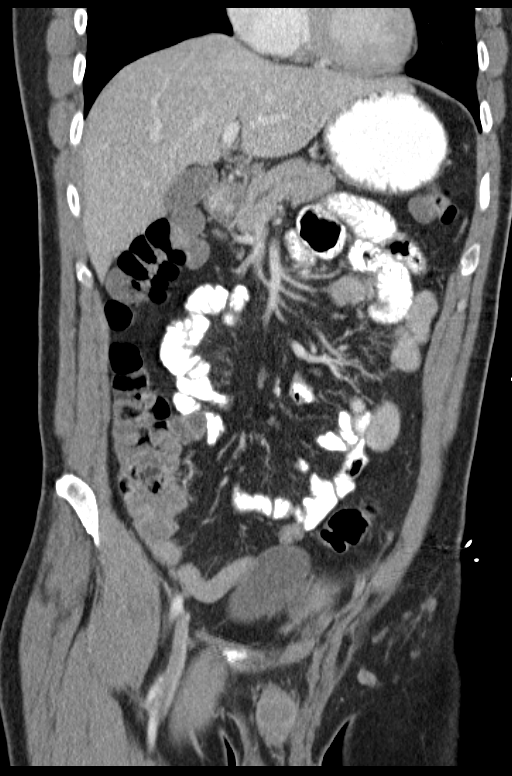
[im 32/71  soft-tissue]
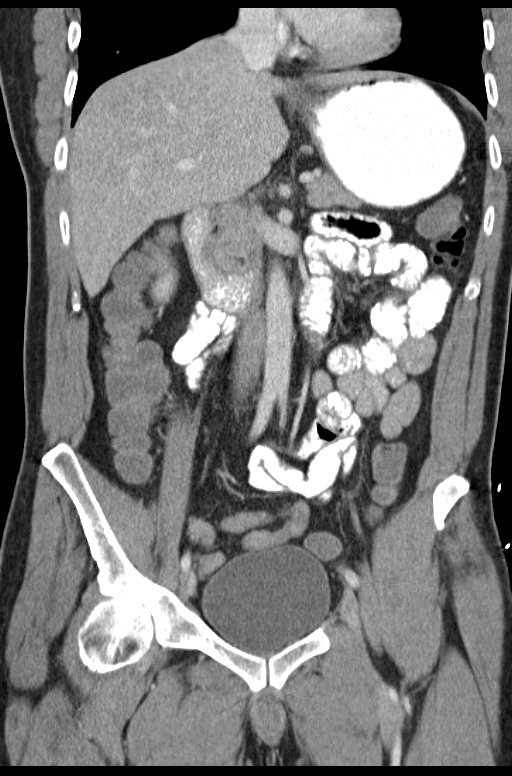
[im 39/71  soft-tissue]
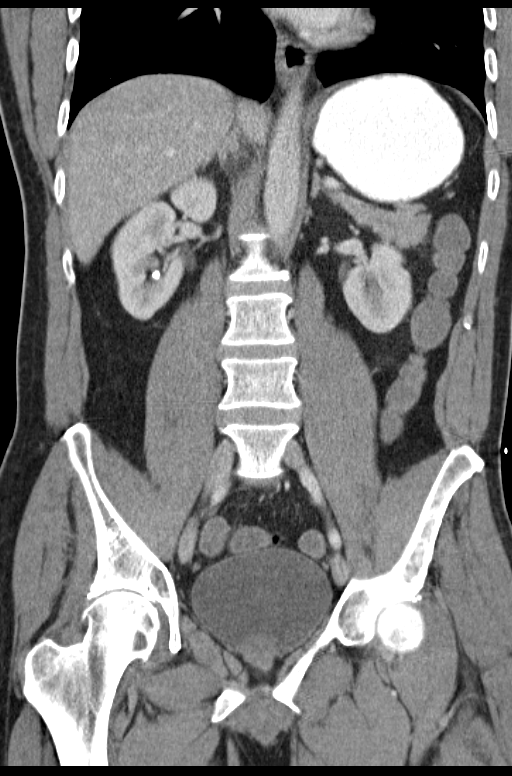

[15 of 46 positions shown; findings below may reference images not displayed]

FINDINGS: Air-fluid levels are noted in a nondistended colon. There is no
colonic wall thickening or mesenteric inflammation. Small bowel is
unremarkable. Appendix not visualized. No acute appendicitis.

Clear lung bases.  Heart normal in size.

Small hiatal hernia.

Liver, spleen, gallbladder, pancreas, adrenal glands:  Normal.

Bilateral nonobstructing intrarenal stones. No renal masses. No
hydronephrosis. Normal ureters. Normal bladder.

No adenopathy.  No ascites.

Arthropathic changes of the hips, right greater than left. Minor
degenerative changes of the visualized spine. No osteoblastic or
osteolytic lesions.
IMPRESSION: 1. Air-fluid levels noted in an otherwise unremarkable colon. No
bowel mass or inflammatory change. Cause of the GI bleeding is
unclear.
2. Normal small bowel.
3. Nonobstructing bilateral intrarenal stones. No ureteral stone or
obstructive uropathy.
4. Arthropathic changes of the hips, right greater than left.
5. Minor degenerative changes of the visualized spine. No other
abnormalities.

## 2015-12-01 IMAGING — CT CT HEAD W/O CM
1 of 2 series · 16 of 30 positions shown, 20 images · non-contrast
Comparison: None.

CLINICAL DATA: Syncope in the setting of GI bleeding.

EXAM:
CT HEAD WITHOUT CONTRAST
TECHNIQUE: Contiguous axial images were obtained from the base of the skull
through the vertex without intravenous contrast.

[Series 3: headtrauma 2.4 h60s · axial · 0.43mm/px · z∈[+91,+246]mm · 16 of 72 slices shown, 20 images]
[im 4/72  brain]
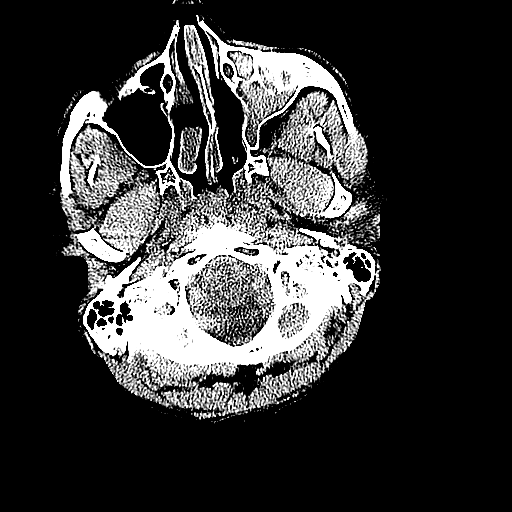
[im 4/72  bone]
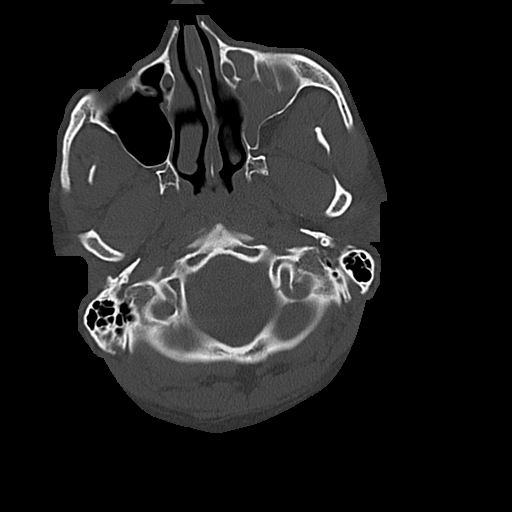
[im 8/72  brain]
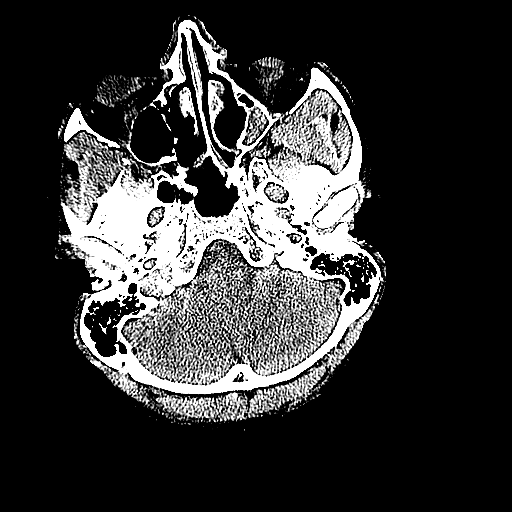
[im 12/72  brain]
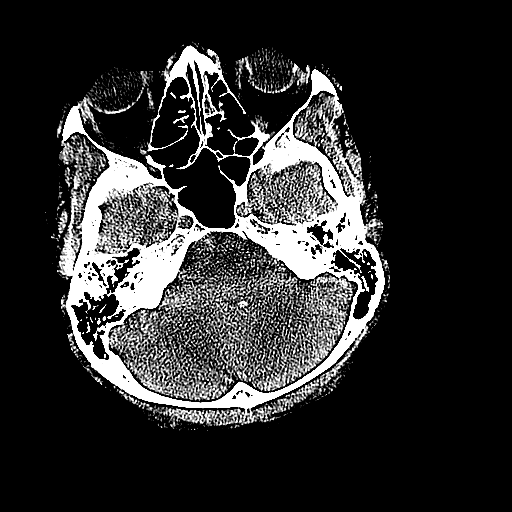
[im 15/72  brain]
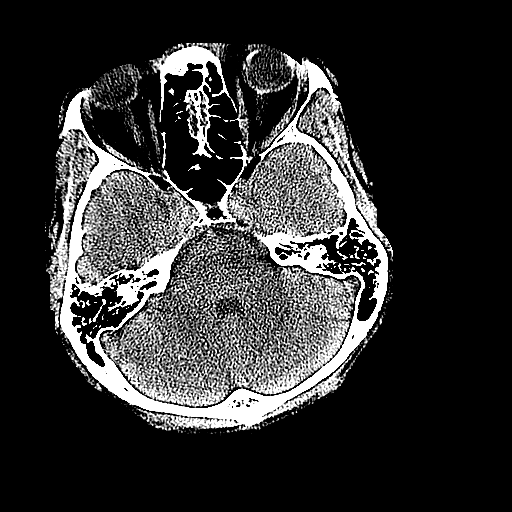
[im 23/72  brain]
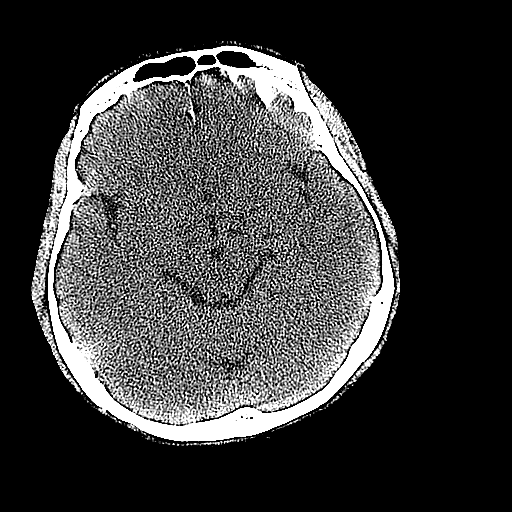
[im 23/72  bone]
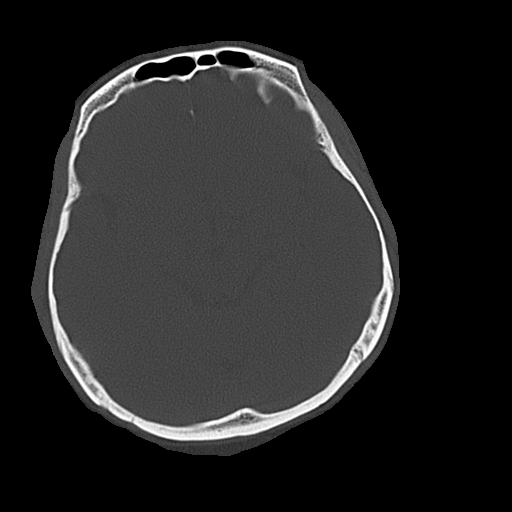
[im 27/72  brain]
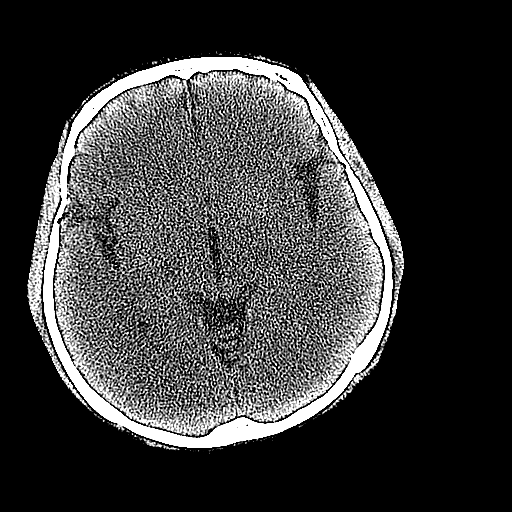
[im 30/72  brain]
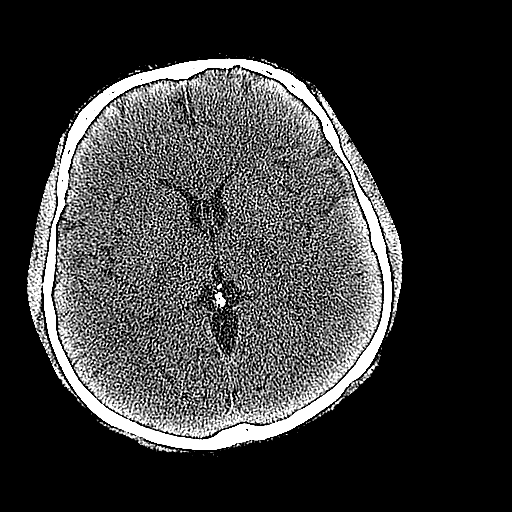
[im 34/72  brain]
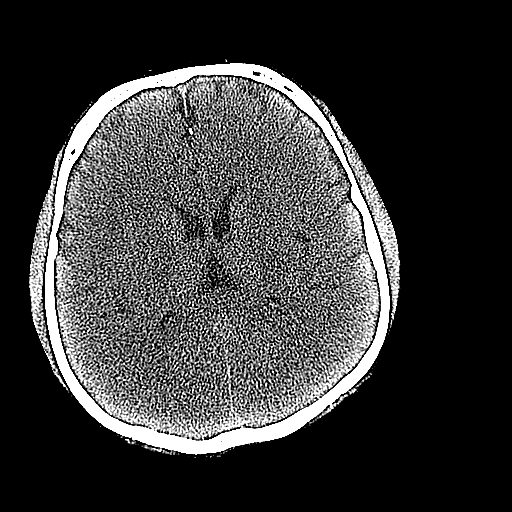
[im 38/72  brain]
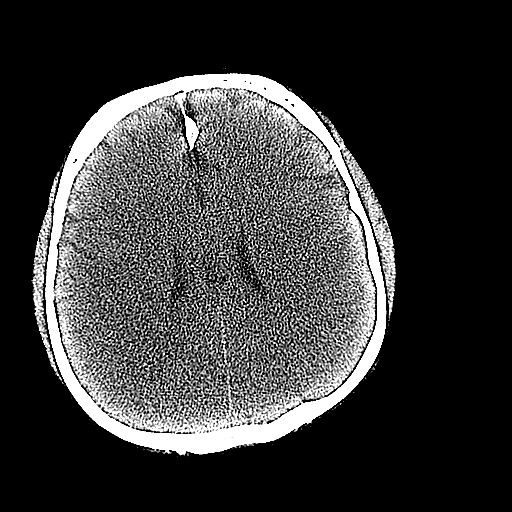
[im 38/72  bone]
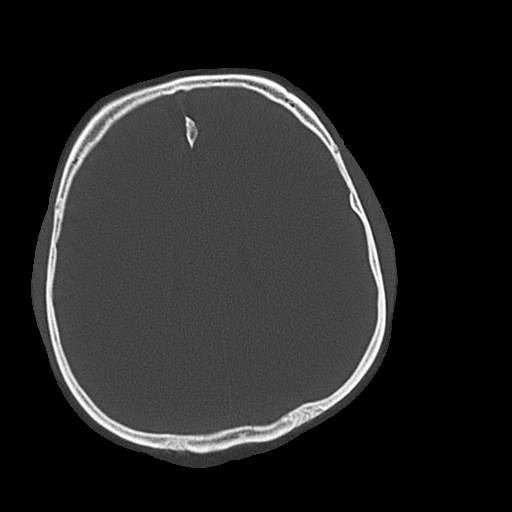
[im 42/72  brain]
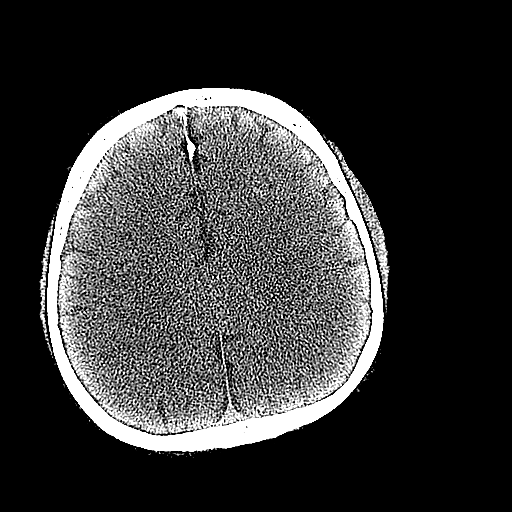
[im 45/72  brain]
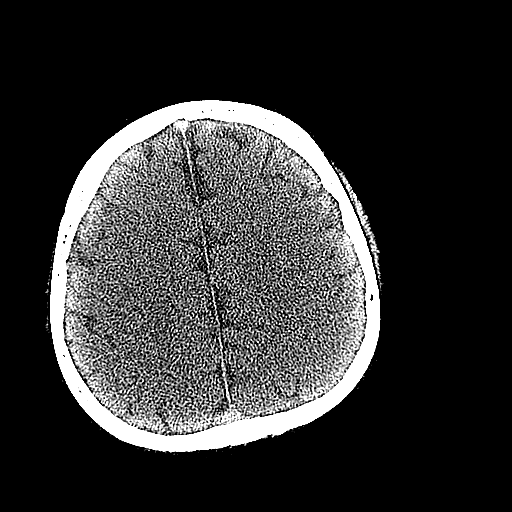
[im 49/72  brain]
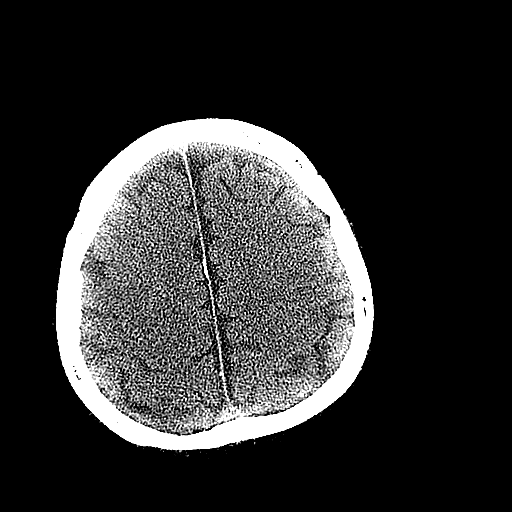
[im 57/72  brain]
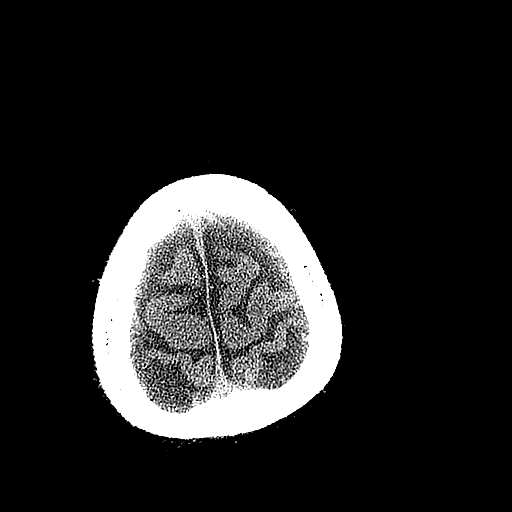
[im 57/72  bone]
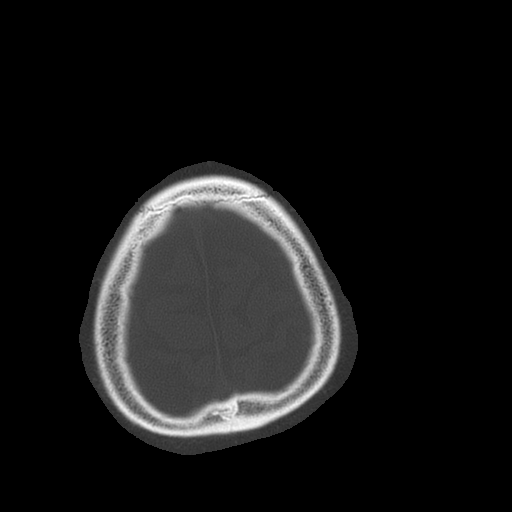
[im 60/72  brain]
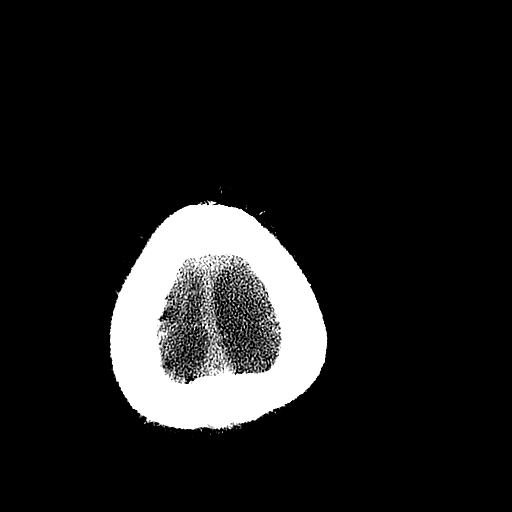
[im 64/72  brain]
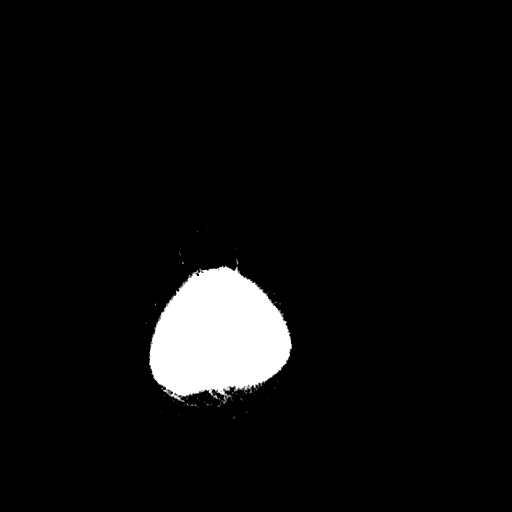
[im 68/72  brain]
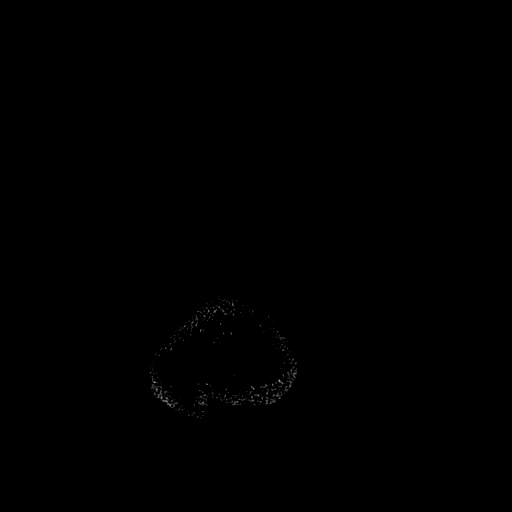

[16 of 30 positions shown; findings below may reference images not displayed]

FINDINGS: Skull and Sinuses:The completely opacified left maxillary sinus
(note the lower portion is not visualized) is atelectatic. These are
the imaging findings of silent sinus syndrome.

Orbits: Bilateral cataract resection and left scleral banding. No
acute findings.

Brain: No evidence of acute infarction, hemorrhage, hydrocephalus,
or mass lesion/mass effect.
IMPRESSION: 1. Negative intracranial imaging.
2. Chronically obstructed left maxillary sinus.

## 2018-07-20 ENCOUNTER — Encounter: Payer: Self-pay | Admitting: Gastroenterology

## 2019-04-28 ENCOUNTER — Other Ambulatory Visit: Payer: Self-pay

## 2019-04-28 DIAGNOSIS — Z20822 Contact with and (suspected) exposure to covid-19: Secondary | ICD-10-CM

## 2019-04-29 LAB — NOVEL CORONAVIRUS, NAA: SARS-CoV-2, NAA: NOT DETECTED

## 2019-05-04 ENCOUNTER — Telehealth: Payer: Self-pay | Admitting: Family Medicine

## 2019-05-04 NOTE — Telephone Encounter (Signed)
Mr Marczak called for his COVID-19 test result. Per lab patient was told that his COVID-19 result was negative. He was not infected with the Novel Corona virus. Pt states that he has no symptoms. S/S were reviewed with patient. He verbalized understanding of all information.

## 2020-03-06 ENCOUNTER — Other Ambulatory Visit: Payer: Self-pay

## 2020-03-06 DIAGNOSIS — R1031 Right lower quadrant pain: Secondary | ICD-10-CM | POA: Diagnosis present

## 2020-03-06 DIAGNOSIS — N132 Hydronephrosis with renal and ureteral calculous obstruction: Secondary | ICD-10-CM | POA: Diagnosis not present

## 2020-03-07 ENCOUNTER — Emergency Department (HOSPITAL_COMMUNITY): Payer: BC Managed Care – PPO

## 2020-03-07 ENCOUNTER — Encounter (HOSPITAL_COMMUNITY): Payer: Self-pay | Admitting: Urology

## 2020-03-07 ENCOUNTER — Ambulatory Visit (INDEPENDENT_AMBULATORY_CARE_PROVIDER_SITE_OTHER): Payer: BC Managed Care – PPO | Admitting: Urology

## 2020-03-07 ENCOUNTER — Emergency Department (HOSPITAL_COMMUNITY)
Admission: EM | Admit: 2020-03-07 | Discharge: 2020-03-07 | Disposition: A | Discharge status: Home / Self Care | Payer: BC Managed Care – PPO | Attending: Emergency Medicine | Admitting: Emergency Medicine

## 2020-03-07 ENCOUNTER — Other Ambulatory Visit: Payer: Self-pay | Admitting: Urology

## 2020-03-07 ENCOUNTER — Encounter: Payer: Self-pay | Admitting: Urology

## 2020-03-07 ENCOUNTER — Encounter (HOSPITAL_COMMUNITY): Payer: Self-pay | Admitting: Emergency Medicine

## 2020-03-07 VITALS — BP 120/79 | HR 76 | Temp 96.8°F | Ht 74.0 in | Wt 187.0 lb

## 2020-03-07 DIAGNOSIS — N200 Calculus of kidney: Secondary | ICD-10-CM | POA: Insufficient documentation

## 2020-03-07 DIAGNOSIS — N201 Calculus of ureter: Secondary | ICD-10-CM

## 2020-03-07 LAB — CBC
HCT: 45.1 % (ref 39.0–52.0)
Hemoglobin: 14.5 g/dL (ref 13.0–17.0)
MCH: 28 pg (ref 26.0–34.0)
MCHC: 32.2 g/dL (ref 30.0–36.0)
MCV: 87.1 fL (ref 80.0–100.0)
Platelets: 275 10*3/uL (ref 150–400)
RBC: 5.18 MIL/uL (ref 4.22–5.81)
RDW: 14.4 % (ref 11.5–15.5)
WBC: 13.4 10*3/uL — ABNORMAL HIGH (ref 4.0–10.5)
nRBC: 0 % (ref 0.0–0.2)

## 2020-03-07 LAB — COMPREHENSIVE METABOLIC PANEL
ALT: 24 U/L (ref 0–44)
AST: 20 U/L (ref 15–41)
Albumin: 4.1 g/dL (ref 3.5–5.0)
Alkaline Phosphatase: 59 U/L (ref 38–126)
Anion gap: 10 (ref 5–15)
BUN: 16 mg/dL (ref 6–20)
CO2: 27 mmol/L (ref 22–32)
Calcium: 9.3 mg/dL (ref 8.9–10.3)
Chloride: 101 mmol/L (ref 98–111)
Creatinine, Ser: 1.09 mg/dL (ref 0.61–1.24)
GFR calc Af Amer: >60 mL/min (ref >60)
GFR calc non Af Amer: >60 mL/min (ref >60)
Glucose, Bld: 107 mg/dL — ABNORMAL HIGH (ref 70–99)
Potassium: 4.5 mmol/L (ref 3.5–5.1)
Sodium: 138 mmol/L (ref 135–145)
Total Bilirubin: 0.6 mg/dL (ref 0.3–1.2)
Total Protein: 7.1 g/dL (ref 6.5–8.1)

## 2020-03-07 LAB — URINALYSIS, ROUTINE W REFLEX MICROSCOPIC
Bacteria, UA: NONE SEEN
Bilirubin Urine: NEGATIVE
Glucose, UA: NEGATIVE mg/dL
Ketones, ur: NEGATIVE mg/dL
Leukocytes,Ua: NEGATIVE
Nitrite: NEGATIVE
Protein, ur: NEGATIVE mg/dL
Specific Gravity, Urine: 1.013 (ref 1.005–1.030)
pH: 6 (ref 5.0–8.0)

## 2020-03-07 LAB — LIPASE, BLOOD: Lipase: 20 U/L (ref 11–51)

## 2020-03-07 MED ORDER — NAPROXEN 500 MG PO TABS
ORAL_TABLET | ORAL | 0 refills | Status: DC
Start: 2020-03-07 — End: 2022-11-26

## 2020-03-07 MED ORDER — KETOROLAC TROMETHAMINE 30 MG/ML IJ SOLN
30.0000 mg | Freq: Once | INTRAMUSCULAR | Status: AC
  Administered 2020-03-07: 30 mg via INTRAVENOUS
  Filled 2020-03-07: qty 1

## 2020-03-07 MED ORDER — PERCOCET 5-325 MG PO TABS: 1.0000 | ORAL_TABLET | Freq: Four times a day (QID) | ORAL | 0 refills | Status: DC | PRN

## 2020-03-07 MED ORDER — ONDANSETRON HCL 4 MG PO TABS: 4.0000 mg | ORAL_TABLET | Freq: Three times a day (TID) | ORAL | 0 refills | Status: DC | PRN

## 2020-03-07 MED ORDER — SODIUM CHLORIDE 0.9 % IV BOLUS
1000.0000 mL | Freq: Once | INTRAVENOUS | Status: AC
  Administered 2020-03-07: 1000 mL via INTRAVENOUS

## 2020-03-07 MED ORDER — ONDANSETRON HCL 4 MG PO TABS
4.0000 mg | ORAL_TABLET | Freq: Three times a day (TID) | ORAL | 0 refills | Status: DC | PRN
Start: 2020-03-07 — End: 2020-03-07

## 2020-03-07 MED ORDER — OXYCODONE-ACETAMINOPHEN 5-325 MG PO TABS: 1.0000 | ORAL_TABLET | ORAL | 0 refills | Status: DC | PRN

## 2020-03-07 MED ORDER — TAMSULOSIN HCL 0.4 MG PO CAPS
ORAL_CAPSULE | ORAL | 0 refills | Status: DC
Start: 2020-03-07 — End: 2022-11-26

## 2020-03-07 MED ORDER — ONDANSETRON HCL 4 MG/2ML IJ SOLN
4.0000 mg | Freq: Once | INTRAMUSCULAR | Status: AC
  Administered 2020-03-07: 4 mg via INTRAVENOUS
  Filled 2020-03-07: qty 2

## 2020-03-07 MED ORDER — SODIUM CHLORIDE 0.9 % IV BOLUS
500.0000 mL | Freq: Once | INTRAVENOUS | Status: AC
  Administered 2020-03-07: 500 mL via INTRAVENOUS

## 2020-03-07 MED ORDER — FENTANYL CITRATE (PF) 100 MCG/2ML IJ SOLN
50.0000 ug | Freq: Once | INTRAMUSCULAR | Status: AC
  Administered 2020-03-07: 50 ug via INTRAVENOUS
  Filled 2020-03-07: qty 2

## 2020-03-07 MED ORDER — IOHEXOL 300 MG/ML  SOLN
100.0000 mL | Freq: Once | INTRAMUSCULAR | Status: AC | PRN
  Administered 2020-03-07: 100 mL via INTRAVENOUS

## 2020-03-07 NOTE — Patient Instructions (Signed)

## 2020-03-07 NOTE — ED Provider Notes (Signed)
[facility] EMERGENCY DEPARTMENT Provider Note   CSN: [id number] Arrival date & time: 03/06/20  2307   Time seen 3:25 AM  History Chief Complaint  Patient presents with  . Abdominal Pain    Ethan Hunter is a 59 y.o. male.  HPI   Patient states about 1:30 PM on June 15 he started having some lower abdominal pain that lasted about an hour.  He had not eaten lunch because he works third shift and he sleeps during the day.  At 7:30 PM while he was at work the pain reoccurred occurred and lasted about 30 minutes.  He states at 9 PM it came back and has not gone away.  He states the pain is tightening like a belt that is too tight.  He has nausea without vomiting.  He denies diarrhea or fever.  He denies any urinary symptoms.  He states bending over makes the pain worse and walking, laying flat makes it feel better.  He has had some loss of appetite although he did eat dinner tonight.  He denies any change in his diet.  He states he has had kidney stones before about 4 times in the last time was about 4 to 5 years ago.  He denies any prior abdominal surgeries.  He states that his pain has never happened before today.  He denies any flank or back pain prior to this.  PCP Quentin Cornwall, MD   Past Medical History:  Diagnosis Date  . Arthritis   . Hypercholesteremia     Patient Active Problem List   Diagnosis Date Noted  . Syncope and collapse   . Duodenal ulcer hemorrhagic   . Syncope 10/10/2014  . Lower GI bleeding 10/10/2014  . ST elevation 10/10/2014  . Gastrointestinal hemorrhage with melena     Past Surgical History:  Procedure Laterality Date  . CATARACT EXTRACTION, BILATERAL Bilateral   . COLONOSCOPY N/A 08/22/2013   Procedure: COLONOSCOPY;  Surgeon: Danie Binder, MD;  [facility];  Service: Endoscopy;  Laterality: N/A;  8:30 AM  . CYSTOSCOPY/RETROGRADE/URETEROSCOPY/STONE EXTRACTION WITH BASKET    . ESOPHAGOGASTRODUODENOSCOPY N/A 10/11/2014   Procedure:  ESOPHAGOGASTRODUODENOSCOPY (EGD);  Surgeon: Irene Shipper, MD;  Location: [facility] ENDOSCOPY;  Service: Endoscopy;  Laterality: N/A;  . RETINAL DETACHMENT SURGERY Bilateral        History reviewed. No pertinent family history.  Social History   Tobacco Use  . Smoking status: Never Smoker  . Smokeless tobacco: Never Used  Vaping Use  . Vaping Use: Never used  Substance Use Topics  . Alcohol use: No  . Drug use: No  employed Lives with spouse  Home Medications Prior to Admission medications   Medication Sig Start Date End Date Taking? Authorizing Provider  fluticasone (FLONASE) 50 MCG/ACT nasal spray Place 1 spray into both nostrils daily as needed for allergies or rhinitis.    [provider]  naproxen (NAPROSYN) 500 MG tablet Take 1 po BID with food prn pain 03/07/20   Rolland Porter, MD  ondansetron (ZOFRAN) 4 MG tablet Take 1 tablet (4 mg total) by mouth every 8 (eight) hours as needed for nausea or vomiting. 03/07/20   Rolland Porter, MD  pantoprazole (PROTONIX) 40 MG tablet Take 1 tablet (40 mg total) by mouth 2 (two) times daily. 10/12/14   Allie Bossier, MD  PERCOCET 5-325 MG tablet Take 1 tablet by mouth every 6 (six) hours as needed for moderate pain or severe pain. 03/07/20  Rolland Porter, MD  simvastatin (ZOCOR) 40 MG tablet Take 40 mg by mouth at bedtime.    [provider]  tamsulosin (FLOMAX) 0.4 MG CAPS capsule Take 1 po QD until you pass the stone. 03/07/20   Rolland Porter, MD  Patient denies taking any medications at this time  Allergies    Patient has no known allergies.  Review of Systems   Review of Systems  All other systems reviewed and are negative.   Physical Exam Updated Vital Signs BP (!) 141/97   Pulse 67   Resp 18   Ht 6\' 2"  (1.88 m)   Wt 92.1 kg   SpO2 98%   BMI 26.06 kg/m   Physical Exam Vitals and nursing note reviewed.  Constitutional:      Appearance: Normal appearance. He is normal weight. He is not toxic-appearing.  HENT:     Head:  Normocephalic and atraumatic.     Right Ear: External ear normal.     Left Ear: External ear normal.     Nose: Nose normal.     Mouth/Throat:     Mouth: Mucous membranes are dry.  Eyes:     Extraocular Movements: Extraocular movements intact.     Conjunctiva/sclera: Conjunctivae normal.     Pupils: Pupils are equal, round, and reactive to light.  Cardiovascular:     Rate and Rhythm: Normal rate and regular rhythm.     Pulses: Normal pulses.     Heart sounds: No murmur heard.   Pulmonary:     Effort: Pulmonary effort is normal. No respiratory distress.     Breath sounds: Normal breath sounds.  Abdominal:     General: Bowel sounds are normal.     Palpations: Abdomen is soft.     Tenderness: There is abdominal tenderness. There is no right CVA tenderness, left CVA tenderness, guarding or rebound.       Comments: To me patient seemed to be most tender over the suprapubic area which could indicate retrocecal appendicitis however he states it hurt more in the left lower quadrant which could indicate diverticulitis.  Musculoskeletal:        General: Normal range of motion.     Cervical back: Normal range of motion.  Skin:    General: Skin is warm and dry.     Findings: No erythema or rash.  Neurological:     General: No focal deficit present.     Mental Status: He is alert and oriented to person, place, and time.     Cranial Nerves: No cranial nerve deficit.  Psychiatric:        Mood and Affect: Mood normal.        Behavior: Behavior normal.        Thought Content: Thought content normal.     ED Results / Procedures / Treatments   Labs (all labs ordered are listed, but only abnormal results are displayed) Results for orders placed or performed during the hospital encounter of 03/07/20  Lipase, blood  Result Value Ref Range   Lipase 20 11 - 51 U/L  Comprehensive metabolic panel  Result Value Ref Range   Sodium 138 135 - 145 mmol/L   Potassium 4.5 3.5 - 5.1 mmol/L    Chloride 101 98 - 111 mmol/L   CO2 27 22 - 32 mmol/L   Glucose, Bld 107 (H) 70 - 99 mg/dL   BUN 16 6 - 20 mg/dL   Creatinine, Ser 1.09 0.61 - 1.24 mg/dL  Calcium 9.3 8.9 - 10.3 mg/dL   Total Protein 7.1 6.5 - 8.1 g/dL   Albumin 4.1 3.5 - 5.0 g/dL   AST 20 15 - 41 U/L   ALT 24 0 - 44 U/L   Alkaline Phosphatase 59 38 - 126 U/L   Total Bilirubin 0.6 0.3 - 1.2 mg/dL   GFR calc non Af Amer >60 >60 mL/min   GFR calc Af Amer >60 >60 mL/min   Anion gap 10 5 - 15  CBC  Result Value Ref Range   WBC 13.4 (H) 4.0 - 10.5 K/uL   RBC 5.18 4.22 - 5.81 MIL/uL   Hemoglobin 14.5 13.0 - 17.0 g/dL   HCT 45.1 39 - 52 %   MCV 87.1 80.0 - 100.0 fL   MCH 28.0 26.0 - 34.0 pg   MCHC 32.2 30.0 - 36.0 g/dL   RDW 14.4 11.5 - 15.5 %   Platelets 275 150 - 400 K/uL   nRBC 0.0 0.0 - 0.2 %  Urinalysis, Routine w reflex microscopic  Result Value Ref Range   Color, Urine YELLOW YELLOW   APPearance CLEAR CLEAR   Specific Gravity, Urine 1.013 1.005 - 1.030   pH 6.0 5.0 - 8.0   Glucose, UA NEGATIVE NEGATIVE mg/dL   Hgb urine dipstick LARGE (A) NEGATIVE   Bilirubin Urine NEGATIVE NEGATIVE   Ketones, ur NEGATIVE NEGATIVE mg/dL   Protein, ur NEGATIVE NEGATIVE mg/dL   Nitrite NEGATIVE NEGATIVE   Leukocytes,Ua NEGATIVE NEGATIVE   RBC / HPF 21-50 0 - 5 RBC/hpf   WBC, UA 0-5 0 - 5 WBC/hpf   Bacteria, UA NONE SEEN NONE SEEN   Mucus PRESENT    Laboratory interpretation all normal except leukocytosis, hematuria expected left renal stone    EKG None  Radiology CT Abdomen Pelvis W Contrast  Result Date: 03/07/2020 CLINICAL DATA:  Lower abdominal pain EXAM: CT ABDOMEN AND PELVIS WITH CONTRAST TECHNIQUE: Multidetector CT imaging of the abdomen and pelvis was performed using the standard protocol following bolus administration of intravenous contrast. CONTRAST:  115mL OMNIPAQUE IOHEXOL 300 MG/ML  SOLN COMPARISON:  October 10, 2014 FINDINGS: Lower chest: The visualized heart size within normal limits. No pericardial  fluid/thickening. No hiatal hernia. The visualized portions of the lungs are clear. Hepatobiliary: The liver is normal in density without focal abnormality.The main portal vein is patent. No evidence of calcified gallstones, gallbladder wall thickening or biliary dilatation. Pancreas: Unremarkable. No pancreatic ductal dilatation or surrounding inflammatory changes. Spleen: Normal in size without focal abnormality. Adrenals/Urinary Tract: Both adrenal glands appear normal. There is mild right-sided pelvicaliectasis and ureterectasis down to the level of the distal ureter where there is a 6 mm calculus present. There are punctate scattered calcifications seen within the right kidney the largest measuring 4 mm in the upper pole. Mild right-sided perinephric stranding is noted. Within the left kidney there is a 5 mm calculus seen within the lower pole. The bladder is partially distended and unremarkable. Stomach/Bowel: The stomach, small bowel, and colon are normal in appearance. No inflammatory changes, wall thickening, or obstructive findings. Scattered diverticula are seen without diverticulitis. The appendix is normal. Vascular/Lymphatic: There are no enlarged mesenteric, retroperitoneal, or pelvic lymph nodes. No significant vascular findings are present. Reproductive: The prostate is unremarkable. Other: No evidence of abdominal wall mass or hernia. Musculoskeletal: No acute or significant osseous findings. IMPRESSION: Mild right hydronephrosis with perinephric stranding to the level of the distal ureter where there is a 6 mm calculus present. Nonobstructing bilateral  renal calculi. Electronically Signed   By: Prudencio Pair M.D.   On: 03/07/2020 04:11    Procedures Procedures (including critical care time)  Medications Ordered in ED Medications  sodium chloride 0.9 % bolus 1,000 mL (1,000 mLs Intravenous New Bag/Given 03/07/20 0417)  sodium chloride 0.9 % bolus 500 mL (500 mLs Intravenous New Bag/Given  03/07/20 0417)  fentaNYL (SUBLIMAZE) injection 50 mcg (50 mcg Intravenous Given 03/07/20 0414)  ondansetron (ZOFRAN) injection 4 mg (4 mg Intravenous Given 03/07/20 0414)  iohexol (OMNIPAQUE) 300 MG/ML solution 100 mL (100 mLs Intravenous Contrast Given 03/07/20 0352)  ketorolac (TORADOL) 30 MG/ML injection 30 mg (30 mg Intravenous Given 03/07/20 0454)    ED Course  I have reviewed the triage vital signs and the nursing notes.  Pertinent labs & imaging results that were available during my care of the patient were reviewed by me and considered in my medical decision making (see chart for details).    MDM Rules/Calculators/A&P                          Patient was given IV fluids and IV pain and nausea medication.  CT was done to look for etiology of his pain.  Patient CT shows he has a right sided ureteral stone.  He was then given IV Toradol.  Recheck 5:20 AM he states he still having pain but it is improving.  We discussed discharge instructions.  His wife is seeing Dr. Alyson Ingles, urologist before.  They would like to see him.  He is not sure if he needs something else for pain, he was advised to let the nurse know before she pulls the IV if he needs something else.  Final Clinical Impression(s) / ED Diagnoses Final diagnoses:  Right ureteral stone    Rx / DC Orders ED Discharge Orders         Ordered    PERCOCET 5-325 MG tablet  Every 6 hours PRN     Discontinue  Reprint     03/07/20 0536    ondansetron (ZOFRAN) 4 MG tablet  Every 8 hours PRN     Discontinue  Reprint     03/07/20 0536    tamsulosin (FLOMAX) 0.4 MG CAPS capsule     Discontinue  Reprint     03/07/20 0536    naproxen (NAPROSYN) 500 MG tablet     Discontinue  Reprint     03/07/20 0536         Plan discharge  Rolland Porter, MD, Barbette Or, MD 03/07/20 [id number]

## 2020-03-07 NOTE — Discharge Instructions (Addendum)
Drink plenty of fluids. Take the medications as prescribed. Return to the ED if you get fever or have uncontrolled vomiting or pain.  You can follow-up with Dr. Alyson Ingles from [facility] urology.

## 2020-03-07 NOTE — Progress Notes (Signed)
Urological Symptom Review  Patient is experiencing the following symptoms:  Hematuria Erection problems Kidney stone  Review of Systems  Gastrointestinal (upper)  : Negative for upper GI symptoms  Gastrointestinal (lower) : Negative for lower GI symptoms  Constitutional : Negative for symptoms  Skin: Negative for skin symptoms  Eyes: Negative for eye symptoms  Ear/Nose/Throat : Negative for Ear/Nose/Throat symptoms  Hematologic/Lymphatic: Negative for Hematologic/Lymphatic symptoms  Cardiovascular : Negative for cardiovascular symptoms  Respiratory : Negative for respiratory symptoms  Endocrine: Negative for endocrine symptoms  Musculoskeletal: Negative for musculoskeletal symptoms  Neurological: Negative for neurological symptoms  Psychologic: Negative for psychiatric symptoms     Went over [place] packet with pt. Including pre instructions and all meds not to take before procedure.

## 2020-03-07 NOTE — ED Triage Notes (Signed)
Pt with c/o upper abdominal pain that started yesterday and is intermittent in nature. Pt c/o nausea but no vomiting.

## 2020-03-07 NOTE — Progress Notes (Signed)
03/07/2020 2:33 PM   Ethan Hunter 10-28-1960 [id number]  Referring provider: Quentin Cornwall, MD [address], [place] [address]  Right flank pain  HPI: Ethan Hunter is a 59yo with a hx of nephrolithiasis here for evaluation of right ureteral calculus. Yesterday he developed sharp, constant severe non radiating right flank pain with associated nausea and vomiting. No fevers. No new LUTS. No hematuria. This is his 4th stone event. He was given rx for flomax in ER   PMH: Past Medical History:  Diagnosis Date  . Arthritis   . Hypercholesteremia   . Hypercholesteremia     Surgical History: Past Surgical History:  Procedure Laterality Date  . CATARACT EXTRACTION, BILATERAL Bilateral   . COLONOSCOPY N/A 08/22/2013   Procedure: COLONOSCOPY;  Surgeon: Danie Binder, MD;  [facility];  Service: Endoscopy;  Laterality: N/A;  8:30 AM  . CYSTOSCOPY/RETROGRADE/URETEROSCOPY/STONE EXTRACTION WITH BASKET    . ESOPHAGOGASTRODUODENOSCOPY N/A 10/11/2014   Procedure: ESOPHAGOGASTRODUODENOSCOPY (EGD);  Surgeon: Irene Shipper, MD;  Location: [facility] ENDOSCOPY;  Service: Endoscopy;  Laterality: N/A;  . JOINT REPLACEMENT    . RETINAL DETACHMENT SURGERY Bilateral     Home Medications:  Allergies as of 03/07/2020   No Known Allergies     Medication List       Accurate as of March 07, 2020  2:33 PM. If you have any questions, ask your nurse or doctor.        fluticasone 50 MCG/ACT nasal spray Commonly known as: FLONASE Place 1 spray into both nostrils daily as needed for allergies or rhinitis.   HYDROcodone-acetaminophen 10-325 MG tablet Commonly known as: NORCO Take 1 tablet by mouth every 6 (six) hours as needed.   naproxen 500 MG tablet Commonly known as: NAPROSYN Take 1 po BID with food prn pain   ondansetron 4 MG tablet Commonly known as: ZOFRAN Take 1 tablet (4 mg total) by mouth every 8 (eight) hours as needed for nausea or vomiting.   pantoprazole 40  MG tablet Commonly known as: PROTONIX Take 1 tablet (40 mg total) by mouth 2 (two) times daily.   Percocet 5-325 MG tablet Generic drug: oxyCODONE-acetaminophen Take 1 tablet by mouth every 6 (six) hours as needed for moderate pain or severe pain.   simvastatin 40 MG tablet Commonly known as: ZOCOR Take 40 mg by mouth at bedtime.   tamsulosin 0.4 MG Caps capsule Commonly known as: Flomax Take 1 po QD until you pass the stone.       Allergies: No Known Allergies  Family History: History reviewed. No pertinent family history.  Social History:  reports that he has never smoked. He has never used smokeless tobacco. He reports that he does not drink alcohol and does not use drugs.  ROS: All other review of systems were reviewed and are negative except what is noted above in HPI  Physical Exam: BP 120/79   Pulse 76   Temp (!) 96.8 F (36 C)   Ht 6\' 2"  (1.88 m)   Wt 187 lb (84.8 kg)   BMI 24.01 kg/m   Constitutional:  Alert and oriented, No acute distress. HEENT: [place] AT, moist mucus membranes.  Trachea midline, no masses. Cardiovascular: No clubbing, cyanosis, or edema. Respiratory: Normal respiratory effort, no increased work of breathing. GI: Abdomen is soft, nontender, nondistended, no abdominal masses GU: No CVA tenderness. Circumcised phallus. No masses/lesions on penis, testis, scrotum. Prostate 40g smooth no nodules no induration.  Lymph:  No cervical or inguinal lymphadenopathy. Skin: No rashes, bruises or suspicious lesions. Neurologic: Grossly intact, no focal deficits, moving all 4 extremities. Psychiatric: Normal mood and affect.  Laboratory Data: Lab Results  Component Value Date   WBC 13.4 (H) 03/07/2020   HGB 14.5 03/07/2020   HCT 45.1 03/07/2020   MCV 87.1 03/07/2020   PLT 275 03/07/2020    Lab Results  Component Value Date   CREATININE 1.09 03/07/2020    No results found for: PSA  No results found for: TESTOSTERONE  No results found for:  HGBA1C  Urinalysis    Component Value Date/Time   COLORURINE YELLOW 03/07/2020 0419   APPEARANCEUR CLEAR 03/07/2020 0419   LABSPEC 1.013 03/07/2020 0419   PHURINE 6.0 03/07/2020 0419   GLUCOSEU NEGATIVE 03/07/2020 0419   HGBUR LARGE (A) 03/07/2020 0419   BILIRUBINUR NEGATIVE 03/07/2020 0419   KETONESUR NEGATIVE 03/07/2020 0419   PROTEINUR NEGATIVE 03/07/2020 0419   UROBILINOGEN 0.2 10/31/2010 1138   NITRITE NEGATIVE 03/07/2020 0419   LEUKOCYTESUR NEGATIVE 03/07/2020 0419    Lab Results  Component Value Date   BACTERIA NONE SEEN 03/07/2020    Pertinent Imaging: CT abd/pelvis w from today: 23mm right distal ureteral calculus Results for orders placed during the hospital encounter of 04/13/13  DG Abd 1 View - KUB  Narrative *RADIOLOGY REPORT*  Clinical Data: Pre lithotripsy.  ABDOMEN - 1 VIEW  Comparison: 04/05/2013  Findings: Bilateral renal stones are noted.  The largest on the right is in the lower pole measuring 5 mm.  Largest on the left is in the upper pole.  This measures 7 mm.  No visualized stone over the expected course of the ureters or bladder.  Nonobstructive bowel gas pattern.  No free air organomegaly.  No acute bony abnormality.  IMPRESSION: Bilateral nephrolithiasis.   Original Report Authenticated By: Rolm Baptise, M.D.  No results found for this or any previous visit.  No results found for this or any previous visit.  No results found for this or any previous visit.  No results found for this or any previous visit.  No results found for this or any previous visit.  No results found for this or any previous visit.  No results found for this or any previous visit.   Assessment & Plan:    1. Nephrolithiasis -We discussed the management of kidney stones. These options include observation, ureteroscopy, shockwave lithotripsy (ESWL) and percutaneous nephrolithotomy (PCNL). We discussed which options are relevant to the patient's stone(s).  We discussed the natural history of kidney stones as well as the complications of untreated stones and the impact on quality of life without treatment as well as with each of the above listed treatments. We also discussed the efficacy of each treatment in its ability to clear the stone burden. With any of these management options I discussed the signs and symptoms of infection and the need for emergent treatment should these be experienced. For each option we discussed the ability of each procedure to clear the patient of their stone burden.   For observation I described the risks which include but are not limited to silent renal damage, life-threatening infection, need for emergent surgery, failure to pass stone and pain.   For ureteroscopy I described the risks which include bleeding, infection, damage to contiguous structures, positioning injury, ureteral stricture, ureteral avulsion, ureteral injury, need for prolonged ureteral stent, inability to perform ureteroscopy, need for an interval procedure, inability to clear stone burden, stent discomfort/pain, heart attack, stroke,  pulmonary embolus and the inherent risks with general anesthesia.   For shockwave lithotripsy I described the risks which include arrhythmia, kidney contusion, kidney hemorrhage, need for transfusion, pain, inability to adequately break up stone, inability to pass stone fragments, Steinstrasse, infection associated with obstructing stones, need for alternate surgical procedure, need for repeat shockwave lithotripsy, MI, CVA, PE and the inherent risks with anesthesia/conscious sedation.   For PCNL I described the risks including positioning injury, pneumothorax, hydrothorax, need for chest tube, inability to clear stone burden, renal laceration, arterial venous fistula or malformation, need for embolization of kidney, loss of kidney or renal function, need for repeat procedure, need for prolonged nephrostomy tube, ureteral avulsion,  MI, CVA, PE and the inherent risks of general anesthesia.   - The patient would like to proceed with ESWL   No follow-ups on file.  Nicolette Bang, MD  [facility] [place]

## 2020-03-08 ENCOUNTER — Ambulatory Visit (HOSPITAL_COMMUNITY): Payer: BC Managed Care – PPO

## 2020-03-08 ENCOUNTER — Other Ambulatory Visit: Payer: Self-pay

## 2020-03-08 ENCOUNTER — Ambulatory Visit (HOSPITAL_COMMUNITY)
Admission: RE | Admit: 2020-03-08 | Discharge: 2020-03-08 | Disposition: A | Discharge status: Home / Self Care | Payer: BC Managed Care – PPO | Attending: Urology | Admitting: Urology

## 2020-03-08 ENCOUNTER — Encounter (HOSPITAL_COMMUNITY)
Admission: RE | Disposition: A | Discharge status: Home / Self Care | Payer: Self-pay | Source: Home / Self Care | Attending: Urology

## 2020-03-08 ENCOUNTER — Encounter (HOSPITAL_COMMUNITY): Payer: Self-pay | Admitting: Urology

## 2020-03-08 DIAGNOSIS — E7800 Pure hypercholesterolemia, unspecified: Secondary | ICD-10-CM | POA: Insufficient documentation

## 2020-03-08 DIAGNOSIS — M1990 Unspecified osteoarthritis, unspecified site: Secondary | ICD-10-CM | POA: Insufficient documentation

## 2020-03-08 DIAGNOSIS — K219 Gastro-esophageal reflux disease without esophagitis: Secondary | ICD-10-CM | POA: Diagnosis not present

## 2020-03-08 DIAGNOSIS — N200 Calculus of kidney: Secondary | ICD-10-CM

## 2020-03-08 DIAGNOSIS — N132 Hydronephrosis with renal and ureteral calculous obstruction: Secondary | ICD-10-CM | POA: Insufficient documentation

## 2020-03-08 DIAGNOSIS — Z87442 Personal history of urinary calculi: Secondary | ICD-10-CM | POA: Diagnosis not present

## 2020-03-08 HISTORY — DX: Personal history of urinary calculi: Z87.442

## 2020-03-08 HISTORY — DX: Gastro-esophageal reflux disease without esophagitis: K21.9

## 2020-03-08 HISTORY — PX: EXTRACORPOREAL SHOCK WAVE LITHOTRIPSY: SHX1557

## 2020-03-08 SURGERY — LITHOTRIPSY, ESWL
Anesthesia: LOCAL | Laterality: Right

## 2020-03-08 MED ORDER — SODIUM CHLORIDE 0.9 % IV SOLN: INTRAVENOUS | Status: DC

## 2020-03-08 MED ORDER — DIAZEPAM 5 MG PO TABS
10.0000 mg | ORAL_TABLET | ORAL | Status: AC
  Administered 2020-03-08: 10 mg via ORAL
  Filled 2020-03-08: qty 2

## 2020-03-08 MED ORDER — CIPROFLOXACIN HCL 500 MG PO TABS
500.0000 mg | ORAL_TABLET | ORAL | Status: AC
  Administered 2020-03-08: 500 mg via ORAL
  Filled 2020-03-08: qty 1

## 2020-03-08 MED ORDER — DIPHENHYDRAMINE HCL 25 MG PO CAPS
25.0000 mg | ORAL_CAPSULE | ORAL | Status: AC
  Administered 2020-03-08: 25 mg via ORAL
  Filled 2020-03-08: qty 1

## 2020-03-08 NOTE — Op Note (Signed)
ESWL Operative Note  Treating Physician: Ellison Hughs, MD  Pre-op diagnosis: 6 mm right UVJ stone  Post-op diagnosis: Same   Procedure: RIGHT ESWL  See Aris Everts OP note scanned into chart. Also because of the size, density, location and other factors that cannot be anticipated I feel this will likely be a staged procedure. This fact supersedes any indication in the scanned [place] stone operative note to the contrary

## 2020-03-08 NOTE — H&P (Signed)
Urology Preoperative H&P   Chief Complaint: Right flank pain  History of Present Illness: Ethan Hunter is a 59 y.o. male with right sided flank pain due to an obstructing 6 mm right distal ureteral calculus seen on CT from 03/07/20.  He is here today for right ESWL.  He denies interval fever/chills, dysuria or hematuria.    Past Medical History:  Diagnosis Date  . Arthritis   . GERD (gastroesophageal reflux disease)   . History of kidney stones   . Hypercholesteremia   . Hypercholesteremia     Past Surgical History:  Procedure Laterality Date  . CATARACT EXTRACTION, BILATERAL Bilateral   . COLONOSCOPY N/A 08/22/2013   Procedure: COLONOSCOPY;  Surgeon: Danie Binder, MD;  [facility]: [facility];  Service: Endoscopy;  Laterality: N/A;  8:30 AM  . CYSTOSCOPY/RETROGRADE/URETEROSCOPY/STONE EXTRACTION WITH BASKET    . ESOPHAGOGASTRODUODENOSCOPY N/A 10/11/2014   Procedure: ESOPHAGOGASTRODUODENOSCOPY (EGD);  Surgeon: Irene Shipper, MD;  Location: [facility] ENDOSCOPY;  Service: Endoscopy;  Laterality: N/A;  . HERNIA REPAIR Right 2011  . JOINT REPLACEMENT Right    right hip- 2016  . LITHOTRIPSY     x1  . RETINAL DETACHMENT SURGERY Bilateral     Allergies: No Known Allergies  History reviewed. No pertinent family history.  Social History:  reports that he has never smoked. He has never used smokeless tobacco. He reports that he does not drink alcohol and does not use drugs.  ROS: A complete review of systems was performed.  All systems are negative except for pertinent findings as noted.  Physical Exam:  Vital signs in last 24 hours: Temp:  [98.2 F (36.8 C)] 98.2 F (36.8 C) (06/17 1416) Pulse Rate:  [76] 76 (06/17 1416) Resp:  [16] 16 (06/17 1416) BP: (131)/(88) 131/88 (06/17 1416) SpO2:  [99 %] 99 % (06/17 1416) Weight:  [84.8 kg] 84.8 kg (06/17 1416) Constitutional:  Alert and oriented, No acute distress Cardiovascular: Regular rate and rhythm, No JVD Respiratory: Normal  respiratory effort, Lungs clear bilaterally GI: Abdomen is soft, nontender, nondistended, no abdominal masses GU: No CVA tenderness Lymphatic: No lymphadenopathy Neurologic: Grossly intact, no focal deficits Psychiatric: Normal mood and affect  Laboratory Data:  Recent Labs    03/07/20 0248  WBC 13.4*  HGB 14.5  HCT 45.1  PLT 275    Recent Labs    03/07/20 0248  NA 138  K 4.5  CL 101  GLUCOSE 107*  BUN 16  CALCIUM 9.3  CREATININE 1.09     No results found for this or any previous visit (from the past 24 hour(s)). No results found for this or any previous visit (from the past 240 hour(s)).  Renal Function: Recent Labs    03/07/20 0248  CREATININE 1.09   Estimated Creatinine Clearance: 88 mL/min (by C-G formula based on SCr of 1.09 mg/dL).  Radiologic Imaging: DG Abd 1 View  Result Date: 03/08/2020 CLINICAL DATA:  Right-sided ureteral calculus with mild obstructive change. EXAM: ABDOMEN - 1 VIEW COMPARISON:  03/07/2020 FINDINGS: Triangular shaped distal right ureteral stone is noted. Bilateral nonobstructing calculi within the kidneys are noted similar to that seen on prior CT. IMPRESSION: Bilateral nonobstructing renal calculi. Distal right ureteral stone is again noted. Electronically Signed   By: Inez Catalina M.D.   On: 03/08/2020 16:07   CT Abdomen Pelvis W Contrast  Result Date: 03/07/2020 CLINICAL DATA:  Lower abdominal pain EXAM: CT ABDOMEN AND PELVIS WITH CONTRAST TECHNIQUE: Multidetector CT imaging of the abdomen  and pelvis was performed using the standard protocol following bolus administration of intravenous contrast. CONTRAST:  124mL OMNIPAQUE IOHEXOL 300 MG/ML  SOLN COMPARISON:  October 10, 2014 FINDINGS: Lower chest: The visualized heart size within normal limits. No pericardial fluid/thickening. No hiatal hernia. The visualized portions of the lungs are clear. Hepatobiliary: The liver is normal in density without focal abnormality.The main portal vein is  patent. No evidence of calcified gallstones, gallbladder wall thickening or biliary dilatation. Pancreas: Unremarkable. No pancreatic ductal dilatation or surrounding inflammatory changes. Spleen: Normal in size without focal abnormality. Adrenals/Urinary Tract: Both adrenal glands appear normal. There is mild right-sided pelvicaliectasis and ureterectasis down to the level of the distal ureter where there is a 6 mm calculus present. There are punctate scattered calcifications seen within the right kidney the largest measuring 4 mm in the upper pole. Mild right-sided perinephric stranding is noted. Within the left kidney there is a 5 mm calculus seen within the lower pole. The bladder is partially distended and unremarkable. Stomach/Bowel: The stomach, small bowel, and colon are normal in appearance. No inflammatory changes, wall thickening, or obstructive findings. Scattered diverticula are seen without diverticulitis. The appendix is normal. Vascular/Lymphatic: There are no enlarged mesenteric, retroperitoneal, or pelvic lymph nodes. No significant vascular findings are present. Reproductive: The prostate is unremarkable. Other: No evidence of abdominal wall mass or hernia. Musculoskeletal: No acute or significant osseous findings. IMPRESSION: Mild right hydronephrosis with perinephric stranding to the level of the distal ureter where there is a 6 mm calculus present. Nonobstructing bilateral renal calculi. Electronically Signed   By: Prudencio Pair M.D.   On: 03/07/2020 04:11    I independently reviewed the above imaging studies.  Assessment and Plan Ethan Hunter is a 59 y.o. male with a 6 mm right distal ureteral calculus   The risks, benefits and alternatives of RIGHT ESWL was discussed with the patient. I described the risks which include arrhythmia, kidney contusion, kidney hemorrhage, need for transfusion, back discomfort, flank ecchymosis, flank abrasion, inability to fracture the stone, inability  to pass stone fragments, Steinstrasse, infection associated with obstructing stones, need for an alternative surgical procedure and possible need for repeat shockwave lithotripsy.  The patient voices understanding and wishes to proceed.     Ellison Hughs, MD 03/08/2020, 4:18 PM  [facility] Pager: [phone]

## 2020-03-09 ENCOUNTER — Encounter (HOSPITAL_COMMUNITY): Payer: Self-pay | Admitting: Urology

## 2020-03-19 ENCOUNTER — Other Ambulatory Visit (HOSPITAL_COMMUNITY)
Admission: AD | Admit: 2020-03-19 | Discharge: 2020-03-19 | Disposition: A | Discharge status: Home / Self Care | Payer: BC Managed Care – PPO | Source: Skilled Nursing Facility | Attending: Urology | Admitting: Urology

## 2020-03-19 ENCOUNTER — Ambulatory Visit (INDEPENDENT_AMBULATORY_CARE_PROVIDER_SITE_OTHER): Payer: BC Managed Care – PPO | Admitting: Urology

## 2020-03-19 ENCOUNTER — Encounter: Payer: Self-pay | Admitting: Urology

## 2020-03-19 ENCOUNTER — Other Ambulatory Visit: Payer: Self-pay

## 2020-03-19 VITALS — BP 98/57 | HR 94 | Temp 98.6°F | Ht 74.0 in | Wt 205.0 lb

## 2020-03-19 DIAGNOSIS — N200 Calculus of kidney: Secondary | ICD-10-CM | POA: Insufficient documentation

## 2020-03-19 NOTE — Progress Notes (Signed)
03/19/2020 3:33 PM   Ethan Hunter 10-12-1960 [id number]  Referring provider: Quentin Cornwall, MD [address] [place] [address]  nephrolithiasis  HPI: Mr Orman is a 59yo here for followup for nephrolithiasis. He underwent ESWL on 6/17. He passed his fragments and brought it with him today. NO flank pain. No LUTS. He increased water intake to 50-60oz daily. He is drinking lemon water   PMH: Past Medical History:  Diagnosis Date  . Arthritis   . GERD (gastroesophageal reflux disease)   . History of kidney stones   . Hypercholesteremia   . Hypercholesteremia     Surgical History: Past Surgical History:  Procedure Laterality Date  . CATARACT EXTRACTION, BILATERAL Bilateral   . COLONOSCOPY N/A 08/22/2013   Procedure: COLONOSCOPY;  Surgeon: Danie Binder, MD;  [facility];  Service: Endoscopy;  Laterality: N/A;  8:30 AM  . CYSTOSCOPY/RETROGRADE/URETEROSCOPY/STONE EXTRACTION WITH BASKET    . ESOPHAGOGASTRODUODENOSCOPY N/A 10/11/2014   Procedure: ESOPHAGOGASTRODUODENOSCOPY (EGD);  Surgeon: Irene Shipper, MD;  Location: [facility] ENDOSCOPY;  Service: Endoscopy;  Laterality: N/A;  . EXTRACORPOREAL SHOCK WAVE LITHOTRIPSY Right 03/08/2020   Procedure: EXTRACORPOREAL SHOCK WAVE LITHOTRIPSY (ESWL);  Surgeon: Ceasar Mons, MD;  Location: [facility];  Service: Urology;  Laterality: Right;  . HERNIA REPAIR Right 2011  . JOINT REPLACEMENT Right    right hip- 2016  . LITHOTRIPSY     x1  . RETINAL DETACHMENT SURGERY Bilateral     Home Medications:  Allergies as of 03/19/2020   No Known Allergies     Medication List       Accurate as of March 19, 2020  3:33 PM. If you have any questions, ask your nurse or doctor.        escitalopram 10 MG tablet Commonly known as: LEXAPRO Take 10 mg by mouth daily.   fluticasone 50 MCG/ACT nasal spray Commonly known as: FLONASE Place 1 spray into both nostrils daily as needed for allergies or rhinitis.     HYDROcodone-acetaminophen 10-325 MG tablet Commonly known as: NORCO Take 1 tablet by mouth every 6 (six) hours as needed.   naproxen 500 MG tablet Commonly known as: NAPROSYN Take 1 po BID with food prn pain   ondansetron 4 MG tablet Commonly known as: ZOFRAN Take 1 tablet (4 mg total) by mouth every 8 (eight) hours as needed for nausea or vomiting.   oxyCODONE-acetaminophen 5-325 MG tablet Commonly known as: PERCOCET/ROXICET Take 1 tablet by mouth every 4 (four) hours as needed for moderate pain or severe pain.   pantoprazole 40 MG tablet Commonly known as: PROTONIX Take 1 tablet (40 mg total) by mouth 2 (two) times daily.   rosuvastatin 10 MG tablet Commonly known as: CRESTOR Take 10 mg by mouth at bedtime.   simvastatin 40 MG tablet Commonly known as: ZOCOR Take 40 mg by mouth at bedtime.   tamsulosin 0.4 MG Caps capsule Commonly known as: Flomax Take 1 po QD until you pass the stone.       Allergies: No Known Allergies  Family History: No family history on file.  Social History:  reports that he has never smoked. He has never used smokeless tobacco. He reports that he does not drink alcohol and does not use drugs.  ROS: All other review of systems were reviewed and are negative except what is noted above in HPI  Physical Exam: BP (!) 98/57   Pulse 94   Temp 98.6 F (37 C)  Ht 6\' 2"  (1.88 m)   Wt 205 lb (93 kg)   BMI 26.32 kg/m   Constitutional:  Alert and oriented, No acute distress. HEENT: [place] AT, moist mucus membranes.  Trachea midline, no masses. Cardiovascular: No clubbing, cyanosis, or edema. Respiratory: Normal respiratory effort, no increased work of breathing. GI: Abdomen is soft, nontender, nondistended, no abdominal masses GU: No CVA tenderness.  Lymph: No cervical or inguinal lymphadenopathy. Skin: No rashes, bruises or suspicious lesions. Neurologic: Grossly intact, no focal deficits, moving all 4 extremities. Psychiatric: Normal mood  and affect.  Laboratory Data: Lab Results  Component Value Date   WBC 13.4 (H) 03/07/2020   HGB 14.5 03/07/2020   HCT 45.1 03/07/2020   MCV 87.1 03/07/2020   PLT 275 03/07/2020    Lab Results  Component Value Date   CREATININE 1.09 03/07/2020    No results found for: PSA  No results found for: TESTOSTERONE  No results found for: HGBA1C  Urinalysis    Component Value Date/Time   COLORURINE YELLOW 03/07/2020 0419   APPEARANCEUR CLEAR 03/07/2020 0419   LABSPEC 1.013 03/07/2020 0419   PHURINE 6.0 03/07/2020 0419   GLUCOSEU NEGATIVE 03/07/2020 0419   HGBUR LARGE (A) 03/07/2020 0419   BILIRUBINUR NEGATIVE 03/07/2020 0419   KETONESUR NEGATIVE 03/07/2020 0419   PROTEINUR NEGATIVE 03/07/2020 0419   UROBILINOGEN 0.2 10/31/2010 1138   NITRITE NEGATIVE 03/07/2020 0419   LEUKOCYTESUR NEGATIVE 03/07/2020 0419    Lab Results  Component Value Date   BACTERIA NONE SEEN 03/07/2020    Pertinent Imaging:  Results for orders placed during the hospital encounter of 03/08/20  DG Abd 1 View  Narrative CLINICAL DATA:  Right-sided ureteral calculus with mild obstructive change.  EXAM: ABDOMEN - 1 VIEW  COMPARISON:  03/07/2020  FINDINGS: Triangular shaped distal right ureteral stone is noted. Bilateral nonobstructing calculi within the kidneys are noted similar to that seen on prior CT.  IMPRESSION: Bilateral nonobstructing renal calculi. Distal right ureteral stone is again noted.   Electronically Signed By: Inez Catalina M.D. On: 03/08/2020 16:07  No results found for this or any previous visit.  No results found for this or any previous visit.  No results found for this or any previous visit.  No results found for this or any previous visit.  No results found for this or any previous visit.  No results found for this or any previous visit.  No results found for this or any previous visit.   Assessment & Plan:    1. Nephrolithiasis -Stone for  analysis -We discussed metabolic evaluation and the patient defers at this time -RTC 3 months with KUB -Dietary handout given   No follow-ups on file.  Nicolette Bang, MD  [facility] [place]

## 2020-03-19 NOTE — Patient Instructions (Signed)
Dietary Guidelines to Help Prevent Kidney Stones Kidney stones are deposits of minerals and salts that form inside your kidneys. Your risk of developing kidney stones may be greater depending on your diet, your lifestyle, the medicines you take, and whether you have certain medical conditions. Most people can reduce their chances of developing kidney stones by following the instructions below. Depending on your overall health and the type of kidney stones you tend to develop, your dietitian may give you more specific instructions. What are tips for following this plan? Reading food labels  Choose foods with "no salt added" or "low-salt" labels. Limit your sodium intake to less than 1500 mg per day.  Choose foods with calcium for each meal and snack. Try to eat about 300 mg of calcium at each meal. Foods that contain 200-500 mg of calcium per serving include: ? 8 oz (237 ml) of milk, fortified nondairy milk, and fortified fruit juice. ? 8 oz (237 ml) of kefir, yogurt, and soy yogurt. ? 4 oz (118 ml) of tofu. ? 1 oz of cheese. ? 1 cup (300 g) of dried figs. ? 1 cup (91 g) of cooked broccoli. ? 1-3 oz can of sardines or mackerel.  Most people need 1000 to 1500 mg of calcium each day. Talk to your dietitian about how much calcium is recommended for you. Shopping  Buy plenty of fresh fruits and vegetables. Most people do not need to avoid fruits and vegetables, even if they contain nutrients that may contribute to kidney stones.  When shopping for convenience foods, choose: ? Whole pieces of fruit. ? Premade salads with dressing on the side. ? Low-fat fruit and yogurt smoothies.  Avoid buying frozen meals or prepared deli foods.  Look for foods with live cultures, such as yogurt and kefir. Cooking  Do not add salt to food when cooking. Place a salt shaker on the table and allow each person to add his or her own salt to taste.  Use vegetable protein, such as beans, textured vegetable  protein (TVP), or tofu instead of meat in pasta, casseroles, and soups. Meal planning   Eat less salt, if told by your dietitian. To do this: ? Avoid eating processed or premade food. ? Avoid eating fast food.  Eat less animal protein, including cheese, meat, poultry, or fish, if told by your dietitian. To do this: ? Limit the number of times you have meat, poultry, fish, or cheese each week. Eat a diet free of meat at least 2 days a week. ? Eat only one serving each day of meat, poultry, fish, or seafood. ? When you prepare animal protein, cut pieces into small portion sizes. For most meat and fish, one serving is about the size of one deck of cards.  Eat at least 5 servings of fresh fruits and vegetables each day. To do this: ? Keep fruits and vegetables on hand for snacks. ? Eat 1 piece of fruit or a handful of berries with breakfast. ? Have a salad and fruit at lunch. ? Have two kinds of vegetables at dinner.  Limit foods that are high in a substance called oxalate. These include: ? Spinach. ? Rhubarb. ? Beets. ? Potato chips and french fries. ? Nuts.  If you regularly take a diuretic medicine, make sure to eat at least 1-2 fruits or vegetables high in potassium each day. These include: ? Avocado. ? Banana. ? Orange, prune, carrot, or tomato juice. ? Baked potato. ? Cabbage. ? Beans and split   peas. General instructions   Drink enough fluid to keep your urine clear or pale yellow. This is the most important thing you can do.  Talk to your health care provider and dietitian about taking daily supplements. Depending on your health and the cause of your kidney stones, you may be advised: ? Not to take supplements with vitamin C. ? To take a calcium supplement. ? To take a daily probiotic supplement. ? To take other supplements such as magnesium, fish oil, or vitamin B6.  Take all medicines and supplements as told by your health care provider.  Limit alcohol intake to no  more than 1 drink a day for nonpregnant women and 2 drinks a day for men. One drink equals 12 oz of beer, 5 oz of wine, or 1 oz of hard liquor.  Lose weight if told by your health care provider. Work with your dietitian to find strategies and an eating plan that works best for you. What foods are not recommended? Limit your intake of the following foods, or as told by your dietitian. Talk to your dietitian about specific foods you should avoid based on the type of kidney stones and your overall health. Grains Breads. Bagels. Rolls. Baked goods. Salted crackers. Cereal. Pasta. Vegetables Spinach. Rhubarb. Beets. Canned vegetables. Pickles. Olives. Meats and other protein foods Nuts. Nut butters. Large portions of meat, poultry, or fish. Salted or cured meats. Deli meats. Hot dogs. Sausages. Dairy Cheese. Beverages Regular soft drinks. Regular vegetable juice. Seasonings and other foods Seasoning blends with salt. Salad dressings. Canned soups. Soy sauce. Ketchup. Barbecue sauce. Canned pasta sauce. Casseroles. Pizza. Lasagna. Frozen meals. Potato chips. French fries. Summary  You can reduce your risk of kidney stones by making changes to your diet.  The most important thing you can do is drink enough fluid. You should drink enough fluid to keep your urine clear or pale yellow.  Ask your health care provider or dietitian how much protein from animal sources you should eat each day, and also how much salt and calcium you should have each day. This information is not intended to replace advice given to you by your health care provider. Make sure you discuss any questions you have with your health care provider. Document Revised: 12/29/2018 Document Reviewed: 08/19/2016 Elsevier Patient Education  2020 Elsevier Inc.  

## 2020-03-19 NOTE — Progress Notes (Signed)
Urological Symptom Review ° °Patient is experiencing the following symptoms: °Get up at night to urinate °Kidney stones ° °Review of Systems ° °Gastrointestinal (upper)  : °Negative for upper GI symptoms ° °Gastrointestinal (lower) : °Negative for lower GI symptoms ° °Constitutional : °Negative for symptoms ° °Skin: °Negative for skin symptoms ° °Eyes: °Negative for eye symptoms ° °Ear/Nose/Throat : °Negative for Ear/Nose/Throat symptoms ° °Hematologic/Lymphatic: °Negative for Hematologic/Lymphatic symptoms ° °Cardiovascular : °Negative for cardiovascular symptoms ° °Respiratory : °Negative for respiratory symptoms ° °Endocrine: °Negative for endocrine symptoms ° °Musculoskeletal: °Negative for musculoskeletal symptoms ° °Neurological: °Negative for neurological symptoms ° °Psychologic: °Negative for psychiatric symptoms °

## 2020-03-29 LAB — CALCULI, WITH PHOTOGRAPH (CLINICAL LAB)
Calcium Oxalate Dihydrate: 20 %
Calcium Oxalate Monohydrate: 80 %
Weight Calculi: 157 mg

## 2020-06-20 ENCOUNTER — Ambulatory Visit: Payer: BC Managed Care – PPO | Admitting: Urology

## 2022-09-03 ENCOUNTER — Ambulatory Visit: Payer: BC Managed Care – PPO | Admitting: Podiatry

## 2022-09-03 ENCOUNTER — Encounter: Payer: Self-pay | Admitting: Podiatry

## 2022-09-03 VITALS — BP 138/62 | HR 65

## 2022-09-03 DIAGNOSIS — L989 Disorder of the skin and subcutaneous tissue, unspecified: Secondary | ICD-10-CM | POA: Diagnosis not present

## 2022-09-03 NOTE — Progress Notes (Signed)
   Chief Complaint  Patient presents with   Callouses    Patient is here for bilateral callouses on bottom of the feet.    Subjective: 61 y.o. male presenting to the office today for evaluation of symptomatic calluses to the plantar aspect of the right forefoot.  Gradual onset.  Minimally symptomatic.  Patient states that his wife made the appointment for him.   Past Medical History:  Diagnosis Date   Arthritis    GERD (gastroesophageal reflux disease)    History of kidney stones    Hypercholesteremia    Hypercholesteremia     Past Surgical History:  Procedure Laterality Date   CATARACT EXTRACTION, BILATERAL Bilateral    COLONOSCOPY N/A 08/22/2013   Procedure: COLONOSCOPY;  Surgeon: Danie Binder, MD;  Location: [facility];  Service: Endoscopy;  Laterality: N/A;  8:30 AM   CYSTOSCOPY/RETROGRADE/URETEROSCOPY/STONE EXTRACTION WITH BASKET     ESOPHAGOGASTRODUODENOSCOPY N/A 10/11/2014   Procedure: ESOPHAGOGASTRODUODENOSCOPY (EGD);  Surgeon: Irene Shipper, MD;  Location: [facility] ENDOSCOPY;  Service: Endoscopy;  Laterality: N/A;   EXTRACORPOREAL SHOCK WAVE LITHOTRIPSY Right 03/08/2020   Procedure: EXTRACORPOREAL SHOCK WAVE LITHOTRIPSY (ESWL);  Surgeon: Ceasar Mons, MD;  Location: [facility];  Service: Urology;  Laterality: Right;   HERNIA REPAIR Right 2011   JOINT REPLACEMENT Right    right hip- 2016   LITHOTRIPSY     x1   RETINAL DETACHMENT SURGERY Bilateral     No Known Allergies   Objective:  Physical Exam General: Alert and oriented x3 in no acute distress  Dermatology: Hyperkeratotic lesion(s) present on the plantar aspect of the right forefoot.  Minimal tenderness on palpation with a central nucleated core noted. Skin is warm, dry and supple bilateral lower extremities. Negative for open lesions or macerations.  Vascular: Palpable pedal pulses bilaterally. No edema or erythema noted. Capillary refill within normal limits.  Neurological: Epicritic and protective  threshold grossly intact bilaterally.   Musculoskeletal Exam: No pedal deformity  Assessment: 1.  Symptomatic callus; benign skin lesion plantar aspect of the right forefoot   Plan of Care:  1. Patient evaluated 2. Excisional debridement of keratoic lesion(s) using a chisel blade was performed without incident.  3.  Recommend good supportive shoes and sneakers 4. Patient is to return to the clinic PRN.   Edrick Kins, DPM [facility]  Dr. Edrick Kins, DPM    [address], [place] [address]                Office [phone]  Fax [phone]

## 2022-11-18 ENCOUNTER — Encounter (INDEPENDENT_AMBULATORY_CARE_PROVIDER_SITE_OTHER): Payer: Self-pay | Admitting: *Deleted

## 2022-11-26 ENCOUNTER — Telehealth (INDEPENDENT_AMBULATORY_CARE_PROVIDER_SITE_OTHER): Payer: Self-pay | Admitting: Gastroenterology

## 2022-11-26 NOTE — Telephone Encounter (Signed)
Who is your primary care physician: Caryl Bis  Reasons for the colonoscopy: Hx polyps   Have you had a colonoscopy before?  yes  Do you have family history of colon cancer? no  Previous colonoscopy with polyps removed? yes  Do you have a history colorectal cancer?   no  Are you diabetic? If yes, Type 1 or Type 2?    no  Do you have a prosthetic or mechanical heart valve? no  Do you have a pacemaker/defibrillator?   no  Have you had endocarditis/atrial fibrillation? no  Have you had joint replacement within the last 12 months?  no  Do you tend to be constipated or have to use laxatives? no  Do you have any history of drugs or alchohol?  no  Do you use supplemental oxygen?  no  Have you had a stroke or heart attack within the last 6 months?no  Do you take weight loss medication? no   Do you take any blood-thinning medications such as: (aspirin, warfarin, Plavix, Aggrenox)  no  If yes we need the name, milligram, dosage and who is prescribing doctor  Current Outpatient Medications on File Prior to Visit  Medication Sig Dispense Refill   loratadine (CLARITIN) 10 MG tablet Take 10 mg by mouth daily.     mometasone (NASONEX) 50 MCG/ACT nasal spray Place 2 sprays into the nose daily.     rosuvastatin (CRESTOR) 10 MG tablet Take 10 mg by mouth at bedtime.     No current facility-administered medications on file prior to visit.    No Known Allergies   Pharmacy: [facility]  Primary Insurance Name: Kae Heller number where you can be reached: [phone]

## 2022-11-26 NOTE — Telephone Encounter (Signed)
Any room Thanks

## 2022-12-02 NOTE — Telephone Encounter (Signed)
FYI- Pt left message returning call to schedule TCS.  Returned call to patient. Pt was look at 12/22/22-12/24/22 but no times available those days. Pt states he was thinking of schedling in June due to he is off every Friday in June and would have a driver. Triage paper placed in April/May folder.

## 2022-12-02 NOTE — Telephone Encounter (Signed)
Thanks June should be fine

## 2022-12-02 NOTE — Telephone Encounter (Signed)
Left message to return call 

## 2023-02-05 NOTE — Telephone Encounter (Signed)
Left message for pt to return call. Placed him on 03/20/23 for time being just to hold until he calls back.

## 2023-02-09 MED ORDER — PEG 3350-KCL-NA BICARB-NACL 420 G PO SOLR: 4000.0000 mL | Freq: Once | ORAL | 0 refills | Status: AC

## 2023-02-09 NOTE — Telephone Encounter (Signed)
Pt wife left message in regards to colonoscopy on 03/20/23. Returned call to wife Rosey Bath. Wife states they will be on vacation the week of 03/20/23. Pt has been rescheduled to 03/05/23 at 12:45 pm. Instructions will be mailed and prep sent to pharmacy.

## 2023-02-09 NOTE — Addendum Note (Signed)
Addended by: Marlowe Shores on: 02/09/2023 08:17 AM   Modules accepted: Orders

## 2023-02-17 ENCOUNTER — Encounter (INDEPENDENT_AMBULATORY_CARE_PROVIDER_SITE_OTHER): Payer: Self-pay | Admitting: *Deleted

## 2023-03-05 ENCOUNTER — Encounter (HOSPITAL_COMMUNITY)
Admission: RE | Disposition: A | Discharge status: Home / Self Care | Payer: Self-pay | Source: Home / Self Care | Attending: Gastroenterology

## 2023-03-05 ENCOUNTER — Ambulatory Visit (HOSPITAL_COMMUNITY): Payer: BC Managed Care – PPO | Admitting: Anesthesiology

## 2023-03-05 ENCOUNTER — Encounter (INDEPENDENT_AMBULATORY_CARE_PROVIDER_SITE_OTHER): Payer: Self-pay | Admitting: *Deleted

## 2023-03-05 ENCOUNTER — Other Ambulatory Visit: Payer: Self-pay

## 2023-03-05 ENCOUNTER — Encounter (HOSPITAL_COMMUNITY): Payer: Self-pay | Admitting: Gastroenterology

## 2023-03-05 ENCOUNTER — Ambulatory Visit (HOSPITAL_COMMUNITY)
Admission: RE | Admit: 2023-03-05 | Discharge: 2023-03-05 | Disposition: A | Discharge status: Home / Self Care | Payer: BC Managed Care – PPO | Attending: Gastroenterology | Admitting: Gastroenterology

## 2023-03-05 DIAGNOSIS — K644 Residual hemorrhoidal skin tags: Secondary | ICD-10-CM | POA: Insufficient documentation

## 2023-03-05 DIAGNOSIS — Z1211 Encounter for screening for malignant neoplasm of colon: Secondary | ICD-10-CM

## 2023-03-05 DIAGNOSIS — K5730 Diverticulosis of large intestine without perforation or abscess without bleeding: Secondary | ICD-10-CM | POA: Diagnosis not present

## 2023-03-05 DIAGNOSIS — Z860100 Personal history of colon polyps, unspecified: Secondary | ICD-10-CM

## 2023-03-05 DIAGNOSIS — E785 Hyperlipidemia, unspecified: Secondary | ICD-10-CM | POA: Diagnosis not present

## 2023-03-05 DIAGNOSIS — K648 Other hemorrhoids: Secondary | ICD-10-CM | POA: Diagnosis not present

## 2023-03-05 DIAGNOSIS — G4730 Sleep apnea, unspecified: Secondary | ICD-10-CM | POA: Insufficient documentation

## 2023-03-05 DIAGNOSIS — D122 Benign neoplasm of ascending colon: Secondary | ICD-10-CM | POA: Diagnosis not present

## 2023-03-05 HISTORY — PX: POLYPECTOMY: SHX5525

## 2023-03-05 HISTORY — PX: COLONOSCOPY WITH PROPOFOL: SHX5780

## 2023-03-05 LAB — HM COLONOSCOPY

## 2023-03-05 SURGERY — COLONOSCOPY WITH PROPOFOL
Anesthesia: General

## 2023-03-05 MED ORDER — LACTATED RINGERS IV SOLN: INTRAVENOUS | Status: DC | PRN

## 2023-03-05 MED ORDER — PROPOFOL 10 MG/ML IV BOLUS
INTRAVENOUS | Status: DC | PRN
  Administered 2023-03-05: 60 mg via INTRAVENOUS
  Administered 2023-03-05: 2 mg via INTRAVENOUS
  Administered 2023-03-05: 20 mg via INTRAVENOUS
  Administered 2023-03-05: 30 mg via INTRAVENOUS

## 2023-03-05 MED ORDER — PROPOFOL 500 MG/50ML IV EMUL
INTRAVENOUS | Status: DC | PRN
  Administered 2023-03-05: 150 ug/kg/min via INTRAVENOUS

## 2023-03-05 MED ORDER — LACTATED RINGERS IV SOLN: INTRAVENOUS | Status: DC

## 2023-03-05 NOTE — Transfer of Care (Signed)
Immediate Anesthesia Transfer of Care Note  Patient: Ethan Hunter  Procedure(s) Performed: COLONOSCOPY WITH PROPOFOL POLYPECTOMY  Patient Location: PACU  Anesthesia Type:General  Level of Consciousness: awake, alert , oriented, and patient cooperative  Airway & Oxygen Therapy: Patient Spontanous Breathing  Post-op Assessment: Report given to RN, Post -op Vital signs reviewed and stable, and Patient moving all extremities X 4  Post vital signs: Reviewed and stable  Last Vitals:  Vitals Value Taken Time  BP 113/46 03/05/23 1303  Temp 36.5 C 03/05/23 1303  Pulse 76 03/05/23 1303  Resp 19 03/05/23 1303  SpO2 97 % 03/05/23 1303    Last Pain:  Vitals:   03/05/23 1303  TempSrc: Oral  PainSc: 0-No pain      Patients Stated Pain Goal: 8 (03/05/23 1112)  Complications: No notable events documented.

## 2023-03-05 NOTE — Discharge Instructions (Signed)
You are being discharged to home.  Resume your previous diet.  We are waiting for your pathology results.  Your physician has recommended a repeat colonoscopy for surveillance based on pathology results.  

## 2023-03-05 NOTE — Anesthesia Postprocedure Evaluation (Signed)
Anesthesia Post Note  Patient: Ethan Hunter  Procedure(s) Performed: COLONOSCOPY WITH PROPOFOL POLYPECTOMY  Patient location during evaluation: Phase II Anesthesia Type: General Level of consciousness: awake and alert and oriented Pain management: pain level controlled Vital Signs Assessment: post-procedure vital signs reviewed and stable Respiratory status: spontaneous breathing, nonlabored ventilation and respiratory function stable Cardiovascular status: blood pressure returned to baseline and stable Postop Assessment: no apparent nausea or vomiting Anesthetic complications: no  No notable events documented.   Last Vitals:  Vitals:   03/05/23 1112 03/05/23 1303  BP: (!) 140/68 (!) 113/46  Pulse: 76 76  Resp: (!) 22 19  Temp: 36.8 C 36.5 C  SpO2: 97% 97%    Last Pain:  Vitals:   03/05/23 1303  TempSrc: Oral  PainSc: 0-No pain                 Curtiss Mahmood C Quinlyn Tep

## 2023-03-05 NOTE — Anesthesia Preprocedure Evaluation (Addendum)
Anesthesia Evaluation  Patient identified by MRN, date of birth, ID band Patient awake    Reviewed: Allergy & Precautions, H&P , NPO status , Patient's Chart, lab work & pertinent test results  Airway Mallampati: III  TM Distance: >3 FB Neck ROM: Full    Dental  (+) Dental Advisory Given, Teeth Intact   Pulmonary sleep apnea (Snoring)    Pulmonary exam normal breath sounds clear to auscultation       Cardiovascular negative cardio ROS Normal cardiovascular exam Rhythm:Regular Rate:Normal     Neuro/Psych negative neurological ROS  negative psych ROS   GI/Hepatic Neg liver ROS, PUD,GERD  Controlled,,  Endo/Other  negative endocrine ROS    Renal/GU Renal disease (stones)  negative genitourinary   Musculoskeletal  (+) Arthritis , Osteoarthritis,    Abdominal   Peds negative pediatric ROS (+)  Hematology negative hematology ROS (+)   Anesthesia Other Findings   Reproductive/Obstetrics negative OB ROS                             Anesthesia Physical Anesthesia Plan  ASA: 2  Anesthesia Plan: General   Post-op Pain Management: Minimal or no pain anticipated   Induction: Intravenous  PONV Risk Score and Plan: 1 and Propofol infusion  Airway Management Planned: Nasal Cannula and Natural Airway  Additional Equipment:   Intra-op Plan:   Post-operative Plan:   Informed Consent: I have reviewed the patients History and Physical, chart, labs and discussed the procedure including the risks, benefits and alternatives for the proposed anesthesia with the patient or authorized representative who has indicated his/her understanding and acceptance.     Dental advisory given  Plan Discussed with: CRNA and Surgeon  Anesthesia Plan Comments:        Anesthesia Quick Evaluation

## 2023-03-05 NOTE — H&P (Signed)
Ethan Hunter is an 62 y.o. male.   Chief Complaint: CRC screening HPI: 62 year old male with medical history of hyperlipidemia, GERD, coming for history of colon polyps.  Last colonoscopy performed in 2014, had 1 polyp removed but no tissue could be retrieved.  The patient denies having any complaints such as melena, hematochezia, abdominal pain or distention, change in her bowel movement consistency or frequency, no changes in weight recently.  No family history of colorectal cancer.   Past Medical History:  Diagnosis Date   Arthritis    GERD (gastroesophageal reflux disease)    History of kidney stones    Hypercholesteremia    Hypercholesteremia     Past Surgical History:  Procedure Laterality Date   CATARACT EXTRACTION, BILATERAL Bilateral    COLONOSCOPY N/A 08/22/2013   Procedure: COLONOSCOPY;  Surgeon: West Bali, MD;  Location: [facility];  Service: Endoscopy;  Laterality: N/A;  8:30 AM   CYSTOSCOPY/RETROGRADE/URETEROSCOPY/STONE EXTRACTION WITH BASKET     ESOPHAGOGASTRODUODENOSCOPY N/A 10/11/2014   Procedure: ESOPHAGOGASTRODUODENOSCOPY (EGD);  Surgeon: Hilarie Fredrickson, MD;  Location: [facility] ENDOSCOPY;  Service: Endoscopy;  Laterality: N/A;   EXTRACORPOREAL SHOCK WAVE LITHOTRIPSY Right 03/08/2020   Procedure: EXTRACORPOREAL SHOCK WAVE LITHOTRIPSY (ESWL);  Surgeon: Rene Paci, MD;  Location: [facility] ORS;  Service: Urology;  Laterality: Right;   HERNIA REPAIR Right 2011   JOINT REPLACEMENT Right    right hip- 2016   LITHOTRIPSY     x1   RETINAL DETACHMENT SURGERY Bilateral     History reviewed. No pertinent family history. Social History:  reports that he has never smoked. He has never used smokeless tobacco. He reports that he does not drink alcohol and does not use drugs.  Allergies:  Allergies  Allergen Reactions   Benzonatate Cough    Medications Prior to Admission  Medication Sig Dispense Refill   ibuprofen (ADVIL) 200 MG tablet Take 200-400 mg by mouth  every 8 (eight) hours as needed (pain.).     mometasone (NASONEX) 50 MCG/ACT nasal spray Place 2 sprays into the nose daily as needed (allergies.).     rosuvastatin (CRESTOR) 10 MG tablet Take 10 mg by mouth in the morning.     levocetirizine (XYZAL) 5 MG tablet Take 5 mg by mouth daily as needed for allergies.      No results found for this or any previous visit (from the past 48 hour(s)). No results found.  Review of Systems  All other systems reviewed and are negative.   Blood pressure (!) 140/68, pulse 76, temperature 98.3 F (36.8 C), temperature source Oral, resp. rate (!) 22, height 6\' 2"  (1.88 m), weight 97.1 kg, SpO2 97 %. Physical Exam  GENERAL: The patient is AO x3, in no acute distress. HEENT: Head is normocephalic and atraumatic. EOMI are intact. Mouth is well hydrated and without lesions. NECK: Supple. No masses LUNGS: Clear to auscultation. No presence of rhonchi/wheezing/rales. Adequate chest expansion HEART: RRR, normal s1 and s2. ABDOMEN: Soft, nontender, no guarding, no peritoneal signs, and nondistended. BS +. No masses. EXTREMITIES: Without any cyanosis, clubbing, rash, lesions or edema. NEUROLOGIC: AOx3, no focal motor deficit. SKIN: no jaundice, no rashes  Assessment/Plan 62 year old male with medical history of hyperlipidemia, GERD, coming for history of colon polyps.  Will proceed with colonoscopy.  Dolores Frame, MD 03/05/2023, 12:04 PM

## 2023-03-05 NOTE — Op Note (Signed)
[facility] Patient Name: Ethan Hunter Procedure Date: 03/05/2023 11:51 AM MRN: [id number] Date of Birth: Oct 05, 1960 Attending MD: Katrinka Blazing , , [id number] CSN: [id number] Age: 62 Admit Type: Outpatient Procedure:                Colonoscopy Indications:              Surveillance: Personal history of colonic polyps                            (unknown histology) on last colonoscopy more than 5                            years ago Providers:                Katrinka Blazing, Marylene Land A. Thomasena Edis RN, RN, Dyann Ruddle Referring MD:              Medicines:                Monitored Anesthesia Care Complications:            No immediate complications. Estimated Blood Loss:     Estimated blood loss: none. Procedure:                Pre-Anesthesia Assessment:                           - Prior to the procedure, a History and Physical                            was performed, and patient medications, allergies                            and sensitivities were reviewed. The patient's                            tolerance of previous anesthesia was reviewed.                           - The risks and benefits of the procedure and the                            sedation options and risks were discussed with the                            patient. All questions were answered and informed                            consent was obtained.                           - ASA Grade Assessment: II - A patient with mild                            systemic disease.  After obtaining informed consent, the colonoscope                            was passed under direct vision. Throughout the                            procedure, the patient's blood pressure, pulse, and                            oxygen saturations were monitored continuously. The                            [id number]-[id number] ([id number]) scope was introduced through                            the anus  and advanced to the the cecum, identified                            by appendiceal orifice and ileocecal valve. Scope In: 12:42:47 PM Scope Out: 1:01:54 PM Scope Withdrawal Time: 0 hours 11 minutes 17 seconds  Total Procedure Duration: 0 hours 19 minutes 7 seconds  Findings:      External hemorrhoids were found on perianal exam.      A 3 mm polyp was found in the ascending colon. The polyp was sessile.       The polyp was removed with a cold snare. Resection and retrieval were       complete.      Scattered small-mouthed diverticula were found in the sigmoid colon.      Non-bleeding internal hemorrhoids were found during retroflexion. The       hemorrhoids were small. Impression:               - Hemorrhoids found on perianal exam.                           - One 3 mm polyp in the ascending colon, removed                            with a cold snare. Resected and retrieved.                           - Diverticulosis in the sigmoid colon.                           - Non-bleeding internal hemorrhoids. Moderate Sedation:      Per Anesthesia Care Recommendation:           - Discharge patient to home (ambulatory).                           - Resume previous diet.                           - Await pathology results.                           - Repeat colonoscopy  for surveillance based on                            pathology results. Procedure Code(s):        --- Professional ---                           [phone], Colonoscopy, flexible; with removal of                            tumor(s), polyp(s), or other lesion(s) by snare                            technique Diagnosis Code(s):        --- Professional ---                           Z86.010, Personal history of colonic polyps                           D12.2, Benign neoplasm of ascending colon                           K64.8, Other hemorrhoids                           K57.30, Diverticulosis of large intestine without                             perforation or abscess without bleeding CPT copyright 2022 [employer]. All rights reserved. The codes documented in this report are preliminary and upon coder review may  be revised to meet current compliance requirements. Katrinka Blazing, MD Katrinka Blazing,  03/05/2023 1:08:07 PM This report has been signed electronically. Number of Addenda: 0

## 2023-03-06 LAB — SURGICAL PATHOLOGY

## 2023-03-11 ENCOUNTER — Encounter (HOSPITAL_COMMUNITY): Payer: Self-pay | Admitting: Gastroenterology

## 2023-05-28 DIAGNOSIS — N201 Calculus of ureter: Secondary | ICD-10-CM | POA: Insufficient documentation

## 2023-05-28 DIAGNOSIS — R7309 Other abnormal glucose: Secondary | ICD-10-CM | POA: Diagnosis not present

## 2023-05-28 DIAGNOSIS — R1030 Lower abdominal pain, unspecified: Secondary | ICD-10-CM | POA: Diagnosis present

## 2023-05-28 DIAGNOSIS — N23 Unspecified renal colic: Secondary | ICD-10-CM | POA: Diagnosis not present

## 2023-05-29 ENCOUNTER — Emergency Department (HOSPITAL_COMMUNITY)
Admission: EM | Admit: 2023-05-29 | Discharge: 2023-05-29 | Disposition: A | Discharge status: Home / Self Care | Payer: BC Managed Care – PPO | Attending: Emergency Medicine | Admitting: Emergency Medicine

## 2023-05-29 ENCOUNTER — Other Ambulatory Visit: Payer: Self-pay

## 2023-05-29 ENCOUNTER — Encounter (HOSPITAL_COMMUNITY): Payer: Self-pay | Admitting: Emergency Medicine

## 2023-05-29 ENCOUNTER — Emergency Department (HOSPITAL_COMMUNITY): Payer: BC Managed Care – PPO

## 2023-05-29 DIAGNOSIS — N23 Unspecified renal colic: Secondary | ICD-10-CM

## 2023-05-29 DIAGNOSIS — R7309 Other abnormal glucose: Secondary | ICD-10-CM

## 2023-05-29 DIAGNOSIS — N201 Calculus of ureter: Secondary | ICD-10-CM

## 2023-05-29 DIAGNOSIS — N200 Calculus of kidney: Secondary | ICD-10-CM

## 2023-05-29 LAB — COMPREHENSIVE METABOLIC PANEL WITH GFR
ALT: 30 U/L (ref 0–44)
AST: 25 U/L (ref 15–41)
Albumin: 4.2 g/dL (ref 3.5–5.0)
Alkaline Phosphatase: 57 U/L (ref 38–126)
Anion gap: 5 (ref 5–15)
BUN: 15 mg/dL (ref 6–20)
CO2: 28 mmol/L (ref 22–32)
Calcium: 8.8 mg/dL — ABNORMAL LOW (ref 8.9–10.3)
Chloride: 102 mmol/L (ref 98–111)
Creatinine, Ser: 1.08 mg/dL (ref 0.61–1.24)
GFR, Estimated: >60 mL/min
Glucose, Bld: 124 mg/dL — ABNORMAL HIGH (ref 70–99)
Potassium: 4.1 mmol/L (ref 3.5–5.1)
Sodium: 135 mmol/L (ref 135–145)
Total Bilirubin: 0.7 mg/dL (ref 0.3–1.2)
Total Protein: 7 g/dL (ref 6.5–8.1)

## 2023-05-29 LAB — URINALYSIS, ROUTINE W REFLEX MICROSCOPIC
Bacteria, UA: NONE SEEN
Bilirubin Urine: NEGATIVE
Glucose, UA: 50 mg/dL — AB
Ketones, ur: NEGATIVE mg/dL
Leukocytes,Ua: NEGATIVE
Nitrite: NEGATIVE
Protein, ur: NEGATIVE mg/dL
RBC / HPF: >50 RBC/hpf (ref 0–5)
Specific Gravity, Urine: 1.016 (ref 1.005–1.030)
pH: 8 (ref 5.0–8.0)

## 2023-05-29 LAB — CBC
HCT: 44.9 % (ref 39.0–52.0)
Hemoglobin: 14.7 g/dL (ref 13.0–17.0)
MCH: 28.2 pg (ref 26.0–34.0)
MCHC: 32.7 g/dL (ref 30.0–36.0)
MCV: 86 fL (ref 80.0–100.0)
Platelets: 275 K/uL (ref 150–400)
RBC: 5.22 MIL/uL (ref 4.22–5.81)
RDW: 14.1 % (ref 11.5–15.5)
WBC: 13.9 K/uL — ABNORMAL HIGH (ref 4.0–10.5)
nRBC: 0 % (ref 0.0–0.2)

## 2023-05-29 LAB — LIPASE, BLOOD: Lipase: 26 U/L (ref 11–51)

## 2023-05-29 MED ORDER — TAMSULOSIN HCL 0.4 MG PO CAPS: 0.4000 mg | ORAL_CAPSULE | Freq: Every day | ORAL | 0 refills | Status: DC

## 2023-05-29 MED ORDER — OXYCODONE-ACETAMINOPHEN 5-325 MG PO TABS
1.0000 | ORAL_TABLET | Freq: Four times a day (QID) | ORAL | 0 refills | Status: AC | PRN
Start: 2023-05-29 — End: ?

## 2023-05-29 MED ORDER — ONDANSETRON 8 MG PO TBDP: 8.0000 mg | ORAL_TABLET | Freq: Three times a day (TID) | ORAL | 1 refills | Status: DC | PRN

## 2023-05-29 MED ORDER — OXYCODONE-ACETAMINOPHEN 5-325 MG PO TABS
1.0000 | ORAL_TABLET | ORAL | 0 refills | Status: DC | PRN
Start: 2023-05-29 — End: 2024-02-16

## 2023-05-29 MED ORDER — ONDANSETRON HCL 4 MG/2ML IJ SOLN
4.0000 mg | Freq: Once | INTRAMUSCULAR | Status: AC
  Administered 2023-05-29: 4 mg via INTRAVENOUS
  Filled 2023-05-29: qty 2

## 2023-05-29 MED ORDER — SODIUM CHLORIDE 0.9 % IV BOLUS
1000.0000 mL | Freq: Once | INTRAVENOUS | Status: AC
  Administered 2023-05-29: 1000 mL via INTRAVENOUS

## 2023-05-29 MED ORDER — MORPHINE SULFATE (PF) 4 MG/ML IV SOLN
4.0000 mg | Freq: Once | INTRAVENOUS | Status: AC
  Administered 2023-05-29: 4 mg via INTRAVENOUS
  Filled 2023-05-29: qty 1

## 2023-05-29 NOTE — ED Triage Notes (Signed)
Pt to ed pov. C/o lower abdominal pain, with n/v that started 3-4 hrs ago. Denies any urinary symptoms. Pt states feels similar to previous kidney stone symptoms. Pt ambulatory

## 2023-05-29 NOTE — ED Notes (Signed)
Requested urine sample from pt. Pt sts that he is unable to provide urine at this time. Call bell within reach. Family at bedside

## 2023-05-29 NOTE — ED Notes (Signed)
Provider at bedside

## 2023-05-29 NOTE — ED Notes (Signed)
Patient transported to CT 

## 2023-05-29 NOTE — ED Provider Notes (Signed)
[place] EMERGENCY DEPARTMENT AT [facility] Provider Note   CSN: [id number] Arrival date & time: 05/28/23  2338     History  No chief complaint on file.   Ethan Hunter is a 62 y.o. male.  The history is provided by the patient.  He has history of hyperlipidemia, kidney stone and comes in complaining of lower abdominal pain since about 7 PM.  There is no radiation of pain.  There has been associated nausea and vomiting x 2.  Denies constipation or diarrhea.  He denies any urinary difficulty.  Pain is similar to what he had with prior kidney stone.   Home Medications Prior to Admission medications   Medication Sig Start Date End Date Taking? Authorizing Provider  ibuprofen (ADVIL) 200 MG tablet Take 200-400 mg by mouth every 8 (eight) hours as needed (pain.).    [provider]  levocetirizine (XYZAL) 5 MG tablet Take 5 mg by mouth daily as needed for allergies.    [provider]  mometasone (NASONEX) 50 MCG/ACT nasal spray Place 2 sprays into the nose daily as needed (allergies.).    [provider]  rosuvastatin (CRESTOR) 10 MG tablet Take 10 mg by mouth in the morning. 03/08/20   [provider]      Allergies    Benzonatate    Review of Systems   Review of Systems  All other systems reviewed and are negative.   Physical Exam Updated Vital Signs BP 138/87   Pulse 77   Temp 97.7 F (36.5 C) (Oral)   Resp 20   Ht 6\' 2"  (1.88 m)   Wt 95.3 kg   SpO2 99%   BMI 26.96 kg/m  Physical Exam Vitals and nursing note reviewed.   62 year old male, resting comfortably and in no acute distress. Vital signs are normal. Oxygen saturation is 99%, which is normal. Head is normocephalic and atraumatic. PERRLA, EOMI. Oropharynx is clear. Neck is nontender and supple without adenopathy. Back is nontender and there is no CVA tenderness. Lungs are clear without rales, wheezes, or rhonchi. Chest is nontender. Heart has regular rate and  rhythm without murmur. Abdomen is soft, flat, with moderate tenderness across the suprapubic area.  Tenderness does not localize.  There is no rebound or guarding. Extremities have no cyanosis or edema, full range of motion is present. Skin is warm and dry without rash. Neurologic: Mental status is normal, cranial nerves are intact, moves all extremities equally.  ED Results / Procedures / Treatments   Labs (all labs ordered are listed, but only abnormal results are displayed) Labs Reviewed  COMPREHENSIVE METABOLIC PANEL - Abnormal; Notable for the following components:      Result Value   Glucose, Bld 124 (*)    Calcium 8.8 (*)    All other components within normal limits  CBC - Abnormal; Notable for the following components:   WBC 13.9 (*)    All other components within normal limits  URINALYSIS, ROUTINE W REFLEX MICROSCOPIC - Abnormal; Notable for the following components:   APPearance HAZY (*)    Glucose, UA 50 (*)    Hgb urine dipstick MODERATE (*)    All other components within normal limits  LIPASE, BLOOD   Radiology CT Renal Stone Study  Result Date: 05/29/2023 CLINICAL DATA:  Lower abdominal pain, flank pain EXAM: CT ABDOMEN AND PELVIS WITHOUT CONTRAST TECHNIQUE: Multidetector CT imaging of the abdomen and pelvis was performed following the standard protocol without IV contrast.  RADIATION DOSE REDUCTION: This exam was performed according to the departmental dose-optimization program which includes automated exposure control, adjustment of the mA and/or kV according to patient size and/or use of iterative reconstruction technique. COMPARISON:  03/07/2020 FINDINGS: Lower chest: No acute abnormality Hepatobiliary: No focal hepatic abnormality. Gallbladder unremarkable. Pancreas: No focal abnormality or ductal dilatation. Spleen: No focal abnormality.  Normal size. Adrenals/Urinary Tract: Multiple bilateral renal stones, the largest in the left lower pole measuring 7 mm. Mild right  hydronephrosis and perinephric stranding. This is due to 7 mm right UVJ stone. No hydronephrosis on the left. Adrenal glands and urinary bladder unremarkable. Stomach/Bowel: Stomach, large and small bowel grossly unremarkable. Vascular/Lymphatic: No evidence of aneurysm or adenopathy. Reproductive: No visible focal abnormality. Other: No free fluid or free air. Musculoskeletal: No acute bony abnormality. IMPRESSION: 7 mm right UVJ stone with mild right hydronephrosis and perinephric stranding. Bilateral nephrolithiasis. Electronically Signed   By: Charlett Nose M.D.   On: 05/29/2023 03:22    Procedures Procedures    Medications Ordered in ED Medications  morphine (PF) 4 MG/ML injection 4 mg (has no administration in time range)  sodium chloride 0.9 % bolus 1,000 mL (1,000 mLs Intravenous New Bag/Given 05/29/23 0251)  morphine (PF) 4 MG/ML injection 4 mg (4 mg Intravenous Given 05/29/23 0252)  ondansetron (ZOFRAN) injection 4 mg (4 mg Intravenous Given 05/29/23 0251)    ED Course/ Medical Decision Making/ A&P                                 Medical Decision Making Amount and/or Complexity of Data Reviewed Labs: ordered. Radiology: ordered.  Risk Prescription drug management.   Lower abdominal pain, etiology unclear.  Differential is broad and includes, but is not limited to, urinary tract infection, urolithiasis, diverticulitis, abdominal aortic aneurysm, appendicitis.  This is a differential which includes conditions with significant risk for morbidity and complications.  I have reviewed his past records, and on 03/07/2020 he had an ED visit with similar presentation at which time he had a right ureteral calculus.  On 03/08/2020 he had extracorporeal lithotripsy for a right ureteral calculus.  I have reviewed his laboratory tests and my interpretation is elevated random glucose level which will need to be followed as an outpatient, mild leukocytosis which is nonspecific.  Urinalysis is significant  for greater than 50 RBCs as well as small mount of glucose present.  I have ordered IV fluids, morphine for pain, ondansetron for nausea and I have ordered a renal stone protocol CT scan.  CT scan shows 7 mm distal right ureteral calculus which is apparently what is causing his pain.  I have independently viewed the images, and agree with the radiologist's interpretation.  He had good but not complete relief of pain with above-noted treatment.  I have ordered a dose of additional morphine for him.  I am discharging him with prescriptions for ondansetron, tamsulosin, oxycodone-acetaminophen and I am also giving him a take-home pack of oxycodone-acetaminophen.  I am referring him to urology for follow-up since calculus of the size may need either lithotripsy or basket retrieval.  He is advised to return for pain is not being adequately controlled or if he develops a fever.  Final Clinical Impression(s) / ED Diagnoses Final diagnoses:  Renal colic  Ureterolithiasis  Elevated random blood glucose level    Rx / DC Orders ED Discharge Orders  Ordered    oxyCODONE-acetaminophen (PERCOCET) 5-325 MG tablet  Every 4 hours PRN        05/29/23 0413    oxyCODONE-acetaminophen (PERCOCET/ROXICET) 5-325 MG tablet  Every 6 hours PRN        05/29/23 0413    tamsulosin (FLOMAX) 0.4 MG CAPS capsule  Daily        05/29/23 0413    ondansetron (ZOFRAN-ODT) 8 MG disintegrating tablet  Every 8 hours PRN        05/29/23 0413              Dione Booze, MD 05/29/23 [phone]

## 2023-05-29 NOTE — Discharge Instructions (Signed)
Please see the urologist as soon as possible.  You may need the shockwave treatment to break up the stone at the same way you had for your last stone.  Return to the emergency department if pain is not being adequately controlled, or if you start running a fever.

## 2023-05-30 LAB — URINE CULTURE: Culture: NO GROWTH

## 2023-06-01 ENCOUNTER — Encounter: Payer: Self-pay | Admitting: Urology

## 2023-06-01 ENCOUNTER — Ambulatory Visit (HOSPITAL_COMMUNITY)
Admission: RE | Admit: 2023-06-01 | Discharge: 2023-06-01 | Disposition: A | Discharge status: Home / Self Care | Payer: BC Managed Care – PPO | Source: Ambulatory Visit | Attending: Urology | Admitting: Urology

## 2023-06-01 ENCOUNTER — Ambulatory Visit (INDEPENDENT_AMBULATORY_CARE_PROVIDER_SITE_OTHER): Payer: BC Managed Care – PPO | Admitting: Urology

## 2023-06-01 VITALS — BP 105/70 | HR 91

## 2023-06-01 DIAGNOSIS — N200 Calculus of kidney: Secondary | ICD-10-CM | POA: Insufficient documentation

## 2023-06-01 LAB — URINALYSIS, ROUTINE W REFLEX MICROSCOPIC
Bilirubin, UA: NEGATIVE
Glucose, UA: NEGATIVE
Ketones, UA: NEGATIVE
Leukocytes,UA: NEGATIVE
Nitrite, UA: NEGATIVE
Protein,UA: NEGATIVE
RBC, UA: NEGATIVE
Specific Gravity, UA: 1.02 (ref 1.005–1.030)
Urobilinogen, Ur: 0.2 mg/dL (ref 0.2–1.0)
pH, UA: 6 (ref 5.0–7.5)

## 2023-06-01 NOTE — Progress Notes (Unsigned)
06/01/2023 1:44 PM   Ethan Hunter 1961/09/13 [id number]  Referring provider: Exie Parody, MD [address] [place],  [place] [address]  nephrolithiasis   HPI: Ethan Hunter is a 62yo here for evaluation of nephrolithiasis. This is his 7th stone event. Starting Friday he developed right flank pain and presented to the ER. He was diagnosed with a 6mm right distal calculus. He passed his calculus and brought it with him today. CT shows bilateral renal calculi which are also present on the KUB from today. No family hx of nephrolithiasis.    PMH: Past Medical History:  Diagnosis Date   Arthritis    GERD (gastroesophageal reflux disease)    History of kidney stones    Hypercholesteremia    Hypercholesteremia     Surgical History: Past Surgical History:  Procedure Laterality Date   CATARACT EXTRACTION, BILATERAL Bilateral    COLONOSCOPY N/A 08/22/2013   Procedure: COLONOSCOPY;  Surgeon: West Bali, MD;  [facility];  Service: Endoscopy;  Laterality: N/A;  8:30 AM   COLONOSCOPY WITH PROPOFOL N/A 03/05/2023   Procedure: COLONOSCOPY WITH PROPOFOL;  Surgeon: Dolores Frame, MD;  [facility]: [facility];  Service: Gastroenterology;  Laterality: N/A;  12:45pm;asa 1-2   CYSTOSCOPY/RETROGRADE/URETEROSCOPY/STONE EXTRACTION WITH BASKET     ESOPHAGOGASTRODUODENOSCOPY N/A 10/11/2014   Procedure: ESOPHAGOGASTRODUODENOSCOPY (EGD);  Surgeon: Hilarie Fredrickson, MD;  Location: [facility];  Service: Endoscopy;  Laterality: N/A;   EXTRACORPOREAL SHOCK WAVE LITHOTRIPSY Right 03/08/2020   Procedure: EXTRACORPOREAL SHOCK WAVE LITHOTRIPSY (ESWL);  Surgeon: Rene Paci, MD;  Location: [facility];  Service: Urology;  Laterality: Right;   HERNIA REPAIR Right 2011   JOINT REPLACEMENT Right    right hip- 2016   LITHOTRIPSY     x1   POLYPECTOMY  03/05/2023   Procedure: POLYPECTOMY;  Surgeon: Dolores Frame, MD;  [facility];  Service: Gastroenterology;;    RETINAL DETACHMENT SURGERY Bilateral     Home Medications:  Allergies as of 06/01/2023       Reactions   Benzonatate Cough        Medication List        Accurate as of June 01, 2023  1:44 PM. If you have any questions, ask your nurse or doctor.          ibuprofen 200 MG tablet Commonly known as: ADVIL Take 200-400 mg by mouth every 8 (eight) hours as needed (pain.).   levocetirizine 5 MG tablet Commonly known as: XYZAL Take 5 mg by mouth daily as needed for allergies.   mometasone 50 MCG/ACT nasal spray Commonly known as: NASONEX Place 2 sprays into the nose daily as needed (allergies.).   ondansetron 8 MG disintegrating tablet Commonly known as: ZOFRAN-ODT Take 1 tablet (8 mg total) by mouth every 8 (eight) hours as needed for nausea or vomiting.   oxyCODONE-acetaminophen 5-325 MG tablet Commonly known as: Percocet Take 1 tablet by mouth every 4 (four) hours as needed for moderate pain.   oxyCODONE-acetaminophen 5-325 MG tablet Commonly known as: PERCOCET/ROXICET Take 1 tablet by mouth every 6 (six) hours as needed for severe pain.   rosuvastatin 10 MG tablet Commonly known as: CRESTOR Take 10 mg by mouth in the morning.   tamsulosin 0.4 MG Caps capsule Commonly known as: FLOMAX Take 1 capsule (0.4 mg total) by mouth daily.        Allergies:  Allergies  Allergen Reactions   Benzonatate Cough    Family History: No family  history on file.  Social History:  reports that he has never smoked. He has never used smokeless tobacco. He reports that he does not drink alcohol and does not use drugs.  ROS: All other review of systems were reviewed and are negative except what is noted above in HPI  Physical Exam: BP 105/70   Pulse 91   Constitutional:  Alert and oriented, No acute distress. HEENT: [place] AT, moist mucus membranes.  Trachea midline, no masses. Cardiovascular: No clubbing, cyanosis, or edema. Respiratory: Normal respiratory effort, no  increased work of breathing. GI: Abdomen is soft, nontender, nondistended, no abdominal masses GU: No CVA tenderness.  Lymph: No cervical or inguinal lymphadenopathy. Skin: No rashes, bruises or suspicious lesions. Neurologic: Grossly intact, no focal deficits, moving all 4 extremities. Psychiatric: Normal mood and affect.  Laboratory Data: Lab Results  Component Value Date   WBC 13.9 (H) 05/29/2023   HGB 14.7 05/29/2023   HCT 44.9 05/29/2023   MCV 86.0 05/29/2023   PLT 275 05/29/2023    Lab Results  Component Value Date   CREATININE 1.08 05/29/2023    No results found for: "PSA"  No results found for: "TESTOSTERONE"  No results found for: "HGBA1C"  Urinalysis    Component Value Date/Time   COLORURINE YELLOW 05/29/2023 0148   APPEARANCEUR HAZY (A) 05/29/2023 0148   LABSPEC 1.016 05/29/2023 0148   PHURINE 8.0 05/29/2023 0148   GLUCOSEU 50 (A) 05/29/2023 0148   HGBUR MODERATE (A) 05/29/2023 0148   BILIRUBINUR NEGATIVE 05/29/2023 0148   KETONESUR NEGATIVE 05/29/2023 0148   PROTEINUR NEGATIVE 05/29/2023 0148   UROBILINOGEN 0.2 10/31/2010 1138   NITRITE NEGATIVE 05/29/2023 0148   LEUKOCYTESUR NEGATIVE 05/29/2023 0148    Lab Results  Component Value Date   BACTERIA NONE SEEN 05/29/2023    Pertinent Imaging: CT 05/26/2023 and KUB today: Images reviewed and discussed with the patient  Results for orders placed during the hospital encounter of 03/08/20  DG Abd 1 View  Narrative CLINICAL DATA:  Right-sided ureteral calculus with mild obstructive change.  EXAM: ABDOMEN - 1 VIEW  COMPARISON:  03/07/2020  FINDINGS: Triangular shaped distal right ureteral stone is noted. Bilateral nonobstructing calculi within the kidneys are noted similar to that seen on prior CT.  IMPRESSION: Bilateral nonobstructing renal calculi. Distal right ureteral stone is again noted.   Electronically Signed By: Alcide Clever M.D. On: 03/08/2020 16:07  No results found for this  or any previous visit.  No results found for this or any previous visit.  No results found for this or any previous visit.  No results found for this or any previous visit.  No valid procedures specified. No results found for this or any previous visit.  Results for orders placed during the hospital encounter of 05/29/23  CT Renal Stone Study  Narrative CLINICAL DATA:  Lower abdominal pain, flank pain  EXAM: CT ABDOMEN AND PELVIS WITHOUT CONTRAST  TECHNIQUE: Multidetector CT imaging of the abdomen and pelvis was performed following the standard protocol without IV contrast.  RADIATION DOSE REDUCTION: This exam was performed according to the departmental dose-optimization program which includes automated exposure control, adjustment of the mA and/or kV according to patient size and/or use of iterative reconstruction technique.  COMPARISON:  03/07/2020  FINDINGS: Lower chest: No acute abnormality  Hepatobiliary: No focal hepatic abnormality. Gallbladder unremarkable.  Pancreas: No focal abnormality or ductal dilatation.  Spleen: No focal abnormality.  Normal size.  Adrenals/Urinary Tract: Multiple bilateral renal stones, the largest in the  left lower pole measuring 7 mm. Mild right hydronephrosis and perinephric stranding. This is due to 7 mm right UVJ stone. No hydronephrosis on the left. Adrenal glands and urinary bladder unremarkable.  Stomach/Bowel: Stomach, large and small bowel grossly unremarkable.  Vascular/Lymphatic: No evidence of aneurysm or adenopathy.  Reproductive: No visible focal abnormality.  Other: No free fluid or free air.  Musculoskeletal: No acute bony abnormality.  IMPRESSION: 7 mm right UVJ stone with mild right hydronephrosis and perinephric stranding.  Bilateral nephrolithiasis.   Electronically Signed By: Charlett Nose M.D. On: 05/29/2023 03:22   Assessment & Plan:    1. Nephrolithiasis -Uric acid, PTH, Calcium -24 hour  urine -followup 4 weeks - Urinalysis, Routine w reflex microscopic   No follow-ups on file.  Wilkie Aye, MD  [facility] [place]

## 2023-06-01 NOTE — Patient Instructions (Signed)

## 2023-06-02 LAB — BASIC METABOLIC PANEL
BUN/Creatinine Ratio: 13 (ref 10–24)
BUN: 14 mg/dL (ref 8–27)
CO2: 25 mmol/L (ref 20–29)
Calcium: 9.7 mg/dL (ref 8.6–10.2)
Chloride: 104 mmol/L (ref 96–106)
Creatinine, Ser: 1.07 mg/dL (ref 0.76–1.27)
Glucose: 91 mg/dL (ref 70–99)
Potassium: 4.4 mmol/L (ref 3.5–5.2)
Sodium: 142 mmol/L (ref 134–144)
eGFR: 79 mL/min/{1.73_m2} (ref >59)

## 2023-06-02 LAB — PTH, INTACT AND CALCIUM: PTH: 33 pg/mL (ref 15–65)

## 2023-06-02 LAB — URIC ACID: Uric Acid: 5.7 mg/dL (ref 3.8–8.4)

## 2023-06-02 MED FILL — Oxycodone w/ Acetaminophen Tab 5-325 MG: ORAL | Qty: 6 | Status: AC

## 2023-06-08 ENCOUNTER — Other Ambulatory Visit: Payer: Self-pay | Admitting: Urology

## 2023-07-02 LAB — LITHOLINK 24HR URINE PANEL
Ammonium, Urine: 45 mmol/(24.h) (ref 15–60)
Calcium Oxalate Saturation: 4.02 — ABNORMAL LOW (ref 6.00–10.00)
Calcium Phosphate Saturation: 1.02 (ref 0.50–2.00)
Calcium, Urine: 230 mg/(24.h) (ref <250)
Calcium/Creatinine Ratio: 102 mg/g{creat} (ref 34–196)
Calcium/Kg Body Weight: 2.4 mg/kg/d (ref <4.0)
Chloride, Urine: 217 mmol/(24.h) (ref 70–250)
Citrate, Urine: 682 mg/(24.h) (ref >450)
Creatinine, Urine: 2266 mg/(24.h)
Creatinine/Kg Body Weight: 23.8 mg/kg/d (ref 11.9–24.4)
Cystine, Urine, Qualitative: NEGATIVE
Magnesium, Urine: 119 mg/(24.h) (ref 30–120)
Oxalate, Urine: 29 mg/(24.h) (ref 20–40)
Phosphorus, Urine: 1453 mg/(24.h) — ABNORMAL HIGH (ref 600–1200)
Potassium, Urine: 48 mmol/(24.h) (ref 20–100)
Protein Catabolic Rate: 1.1 g/kg/d (ref 0.8–1.4)
Sodium, Urine: 234 mmol/(24.h) — ABNORMAL HIGH (ref 50–150)
Sulfate, Urine: 50 meq/(24.h) (ref 20–80)
Urea Nitrogen, Urine: 14.16 g/(24.h) — ABNORMAL HIGH (ref 6.00–14.00)
Uric Acid Saturation: 1.04 — ABNORMAL HIGH (ref <1.00)
Uric Acid, Urine: 1038 mg/(24.h) — ABNORMAL HIGH (ref <800)
Urine Volume (Preserved): 2160 mL/(24.h) (ref 500–4000)
pH, 24 hr, Urine: 5.958 (ref 5.800–6.200)

## 2023-07-02 LAB — SPECIMEN STATUS REPORT

## 2023-07-08 ENCOUNTER — Ambulatory Visit: Payer: BC Managed Care – PPO | Admitting: Urology

## 2023-07-08 VITALS — BP 103/65 | HR 73

## 2023-07-08 DIAGNOSIS — N200 Calculus of kidney: Secondary | ICD-10-CM

## 2023-07-08 MED ORDER — POTASSIUM CITRATE ER 15 MEQ (1620 MG) PO TBCR: 1.0000 | EXTENDED_RELEASE_TABLET | Freq: Two times a day (BID) | ORAL | 11 refills | Status: DC

## 2023-07-08 NOTE — Patient Instructions (Signed)

## 2023-07-08 NOTE — Progress Notes (Signed)
07/08/2023 3:52 PM   Shawna Orleans 1961/02/28 [id number]  Referring provider: Exie Parody, MD [address] [place],  [place] [address]   Followup nephrolithiasis  HPI: Mr Bosshart is a 62yo here for followup for nephrolithiasis. 24 hour urine showed volume 2.1L, calcium normal at 230, citrate normal at 680 and uric acid high at 1000. Blood uric acid level normal. He denies any flank pain. No stone events since last visit.   PMH: Past Medical History:  Diagnosis Date   Arthritis    GERD (gastroesophageal reflux disease)    History of kidney stones    Hypercholesteremia    Hypercholesteremia     Surgical History: Past Surgical History:  Procedure Laterality Date   CATARACT EXTRACTION, BILATERAL Bilateral    COLONOSCOPY N/A 08/22/2013   Procedure: COLONOSCOPY;  Surgeon: West Bali, MD;  [facility];  Service: Endoscopy;  Laterality: N/A;  8:30 AM   COLONOSCOPY WITH PROPOFOL N/A 03/05/2023   Procedure: COLONOSCOPY WITH PROPOFOL;  Surgeon: Dolores Frame, MD;  [facility];  Service: Gastroenterology;  Laterality: N/A;  12:45pm;asa 1-2   CYSTOSCOPY/RETROGRADE/URETEROSCOPY/STONE EXTRACTION WITH BASKET     ESOPHAGOGASTRODUODENOSCOPY N/A 10/11/2014   Procedure: ESOPHAGOGASTRODUODENOSCOPY (EGD);  Surgeon: Hilarie Fredrickson, MD;  Location: [facility];  Service: Endoscopy;  Laterality: N/A;   EXTRACORPOREAL SHOCK WAVE LITHOTRIPSY Right 03/08/2020   Procedure: EXTRACORPOREAL SHOCK WAVE LITHOTRIPSY (ESWL);  Surgeon: Rene Paci, MD;  Location: [facility];  Service: Urology;  Laterality: Right;   HERNIA REPAIR Right 2011   JOINT REPLACEMENT Right    right hip- 2016   LITHOTRIPSY     x1   POLYPECTOMY  03/05/2023   Procedure: POLYPECTOMY;  Surgeon: Dolores Frame, MD;  [facility];  Service: Gastroenterology;;   RETINAL DETACHMENT SURGERY Bilateral     Home Medications:  Allergies as of 07/08/2023       Reactions    Benzonatate Cough        Medication List        Accurate as of July 08, 2023  3:52 PM. If you have any questions, ask your nurse or doctor.          ibuprofen 200 MG tablet Commonly known as: ADVIL Take 200-400 mg by mouth every 8 (eight) hours as needed (pain.).   levocetirizine 5 MG tablet Commonly known as: XYZAL Take 5 mg by mouth daily as needed for allergies.   mometasone 50 MCG/ACT nasal spray Commonly known as: NASONEX Place 2 sprays into the nose daily as needed (allergies.).   ondansetron 8 MG disintegrating tablet Commonly known as: ZOFRAN-ODT Take 1 tablet (8 mg total) by mouth every 8 (eight) hours as needed for nausea or vomiting.   oxyCODONE-acetaminophen 5-325 MG tablet Commonly known as: Percocet Take 1 tablet by mouth every 4 (four) hours as needed for moderate pain.   oxyCODONE-acetaminophen 5-325 MG tablet Commonly known as: PERCOCET/ROXICET Take 1 tablet by mouth every 6 (six) hours as needed for severe pain.   rosuvastatin 10 MG tablet Commonly known as: CRESTOR Take 10 mg by mouth in the morning.   tamsulosin 0.4 MG Caps capsule Commonly known as: FLOMAX Take 1 capsule (0.4 mg total) by mouth daily.        Allergies:  Allergies  Allergen Reactions   Benzonatate Cough    Family History: No family history on file.  Social History:  reports that he has never smoked. He has never used smokeless tobacco. He  reports that he does not drink alcohol and does not use drugs.  ROS: All other review of systems were reviewed and are negative except what is noted above in HPI  Physical Exam: BP 103/65   Pulse 73   Constitutional:  Alert and oriented, No acute distress. HEENT: [place] AT, moist mucus membranes.  Trachea midline, no masses. Cardiovascular: No clubbing, cyanosis, or edema. Respiratory: Normal respiratory effort, no increased work of breathing. GI: Abdomen is soft, nontender, nondistended, no abdominal masses GU: No CVA  tenderness.  Lymph: No cervical or inguinal lymphadenopathy. Skin: No rashes, bruises or suspicious lesions. Neurologic: Grossly intact, no focal deficits, moving all 4 extremities. Psychiatric: Normal mood and affect.  Laboratory Data: Lab Results  Component Value Date   WBC 13.9 (H) 05/29/2023   HGB 14.7 05/29/2023   HCT 44.9 05/29/2023   MCV 86.0 05/29/2023   PLT 275 05/29/2023    Lab Results  Component Value Date   CREATININE 1.07 06/01/2023    No results found for: "PSA"  No results found for: "TESTOSTERONE"  No results found for: "HGBA1C"  Urinalysis    Component Value Date/Time   COLORURINE YELLOW 05/29/2023 0148   APPEARANCEUR Clear 06/01/2023 1343   LABSPEC 1.016 05/29/2023 0148   PHURINE 8.0 05/29/2023 0148   GLUCOSEU Negative 06/01/2023 1343   HGBUR MODERATE (A) 05/29/2023 0148   BILIRUBINUR Negative 06/01/2023 1343   KETONESUR NEGATIVE 05/29/2023 0148   PROTEINUR Negative 06/01/2023 1343   PROTEINUR NEGATIVE 05/29/2023 0148   UROBILINOGEN 0.2 10/31/2010 1138   NITRITE Negative 06/01/2023 1343   NITRITE NEGATIVE 05/29/2023 0148   LEUKOCYTESUR Negative 06/01/2023 1343   LEUKOCYTESUR NEGATIVE 05/29/2023 0148    Lab Results  Component Value Date   LABMICR Comment 06/01/2023   BACTERIA NONE SEEN 05/29/2023    Pertinent Imaging:  Results for orders placed in visit on 05/29/23  DG Abd 1 View  Narrative CLINICAL DATA:  Nephrolithiasis  EXAM: ABDOMEN - 1 VIEW  COMPARISON:  03/08/2020  FINDINGS: The bowel gas pattern is normal. 7 mm calcification overlies the left kidney.  IMPRESSION: Calcification consistent left-sided stone. Unremarkable bowel gas pattern.   Electronically Signed By: Layla Maw M.D. On: 06/11/2023 19:41  No results found for this or any previous visit.  No results found for this or any previous visit.  No results found for this or any previous visit.  No results found for this or any previous visit.  No  valid procedures specified. No results found for this or any previous visit.  Results for orders placed during the hospital encounter of 05/29/23  CT Renal Stone Study  Narrative CLINICAL DATA:  Lower abdominal pain, flank pain  EXAM: CT ABDOMEN AND PELVIS WITHOUT CONTRAST  TECHNIQUE: Multidetector CT imaging of the abdomen and pelvis was performed following the standard protocol without IV contrast.  RADIATION DOSE REDUCTION: This exam was performed according to the departmental dose-optimization program which includes automated exposure control, adjustment of the mA and/or kV according to patient size and/or use of iterative reconstruction technique.  COMPARISON:  03/07/2020  FINDINGS: Lower chest: No acute abnormality  Hepatobiliary: No focal hepatic abnormality. Gallbladder unremarkable.  Pancreas: No focal abnormality or ductal dilatation.  Spleen: No focal abnormality.  Normal size.  Adrenals/Urinary Tract: Multiple bilateral renal stones, the largest in the left lower pole measuring 7 mm. Mild right hydronephrosis and perinephric stranding. This is due to 7 mm right UVJ stone. No hydronephrosis on the left. Adrenal glands and urinary bladder unremarkable.  Stomach/Bowel: Stomach, large and small bowel grossly unremarkable.  Vascular/Lymphatic: No evidence of aneurysm or adenopathy.  Reproductive: No visible focal abnormality.  Other: No free fluid or free air.  Musculoskeletal: No acute bony abnormality.  IMPRESSION: 7 mm right UVJ stone with mild right hydronephrosis and perinephric stranding.  Bilateral nephrolithiasis.   Electronically Signed By: Charlett Nose M.D. On: 05/29/2023 03:22   Assessment & Plan:    1. Nephrolithiasis -We will trial Urocit K BID -followup 2 weeks with BMP -Followup 3 months with KUB - Urinalysis, Routine w reflex microscopic   No follow-ups on file.  Wilkie Aye, MD  [facility]  [place]

## 2023-07-09 LAB — URINALYSIS, ROUTINE W REFLEX MICROSCOPIC
Bilirubin, UA: NEGATIVE
Glucose, UA: NEGATIVE
Ketones, UA: NEGATIVE
Leukocytes,UA: NEGATIVE
Nitrite, UA: NEGATIVE
Protein,UA: NEGATIVE
RBC, UA: NEGATIVE
Specific Gravity, UA: 1.02 (ref 1.005–1.030)
Urobilinogen, Ur: 0.2 mg/dL (ref 0.2–1.0)
pH, UA: 7 (ref 5.0–7.5)

## 2023-07-10 ENCOUNTER — Telehealth: Payer: Self-pay

## 2023-07-10 NOTE — Telephone Encounter (Signed)
Patient called advising he has abdominal pain following surgery for the past two days. He advised it has only been relieved with his pain medication. He wanted to know if that was a normal occurrence following the procedure. He requested a call back.

## 2023-07-10 NOTE — Telephone Encounter (Signed)
Returned patients call. Advised patient to start taking the medications prescribed by MD and increase fluid intake.  Patient made aware that  message would be sent to MD. Advised patient to go to ER or urgent care is pain becomes unbearable. Patient will follow up Monday if needed.

## 2023-07-16 ENCOUNTER — Encounter: Payer: Self-pay | Admitting: Urology

## 2023-07-23 ENCOUNTER — Other Ambulatory Visit: Payer: BC Managed Care – PPO

## 2023-07-23 DIAGNOSIS — N200 Calculus of kidney: Secondary | ICD-10-CM

## 2023-07-24 LAB — BASIC METABOLIC PANEL
BUN/Creatinine Ratio: 11 (ref 10–24)
BUN: 11 mg/dL (ref 8–27)
CO2: 23 mmol/L (ref 20–29)
Calcium: 9.1 mg/dL (ref 8.6–10.2)
Chloride: 103 mmol/L (ref 96–106)
Creatinine, Ser: 0.97 mg/dL (ref 0.76–1.27)
Glucose: 77 mg/dL (ref 70–99)
Potassium: 4.5 mmol/L (ref 3.5–5.2)
Sodium: 140 mmol/L (ref 134–144)
eGFR: 89 mL/min/{1.73_m2} (ref >59)

## 2023-10-21 ENCOUNTER — Ambulatory Visit: Payer: BC Managed Care – PPO | Admitting: Urology

## 2023-12-23 ENCOUNTER — Ambulatory Visit (INDEPENDENT_AMBULATORY_CARE_PROVIDER_SITE_OTHER): Payer: BC Managed Care – PPO | Admitting: Urology

## 2023-12-23 ENCOUNTER — Ambulatory Visit (HOSPITAL_COMMUNITY)
Admission: RE | Admit: 2023-12-23 | Discharge: 2023-12-23 | Disposition: A | Discharge status: Home / Self Care | Source: Ambulatory Visit | Attending: Urology | Admitting: Urology

## 2023-12-23 VITALS — BP 119/75 | HR 82

## 2023-12-23 DIAGNOSIS — N200 Calculus of kidney: Secondary | ICD-10-CM | POA: Diagnosis present

## 2023-12-23 MED ORDER — POTASSIUM CITRATE ER 15 MEQ (1620 MG) PO TBCR: 1.0000 | EXTENDED_RELEASE_TABLET | Freq: Two times a day (BID) | ORAL | 11 refills | Status: AC

## 2023-12-23 NOTE — Progress Notes (Signed)
 12/23/2023 4:08 PM   Shawna Orleans 02-06-1961 [id number]  Referring provider: Exie Parody, MD [address] [place],  [place] [address]  Nephrolithiasis   HPI: Mr Ericsson is a 62yo here for followup for nephrolithiasis. KUB fos 3x51mm left lower pole calculus. No flank pain. NO worsening LUTS. He is on potassium citrate BID.    PMH: Past Medical History:  Diagnosis Date   Arthritis    GERD (gastroesophageal reflux disease)    History of kidney stones    Hypercholesteremia    Hypercholesteremia     Surgical History: Past Surgical History:  Procedure Laterality Date   CATARACT EXTRACTION, BILATERAL Bilateral    COLONOSCOPY N/A 08/22/2013   Procedure: COLONOSCOPY;  Surgeon: West Bali, MD;  Location: [facility];  Service: Endoscopy;  Laterality: N/A;  8:30 AM   COLONOSCOPY WITH PROPOFOL N/A 03/05/2023   Procedure: COLONOSCOPY WITH PROPOFOL;  Surgeon: Dolores Frame, MD;  Location: [facility];  Service: Gastroenterology;  Laterality: N/A;  12:45pm;asa 1-2   CYSTOSCOPY/RETROGRADE/URETEROSCOPY/STONE EXTRACTION WITH BASKET     ESOPHAGOGASTRODUODENOSCOPY N/A 10/11/2014   Procedure: ESOPHAGOGASTRODUODENOSCOPY (EGD);  Surgeon: Hilarie Fredrickson, MD;  Location: [facility];  Service: Endoscopy;  Laterality: N/A;   EXTRACORPOREAL SHOCK WAVE LITHOTRIPSY Right 03/08/2020   Procedure: EXTRACORPOREAL SHOCK WAVE LITHOTRIPSY (ESWL);  Surgeon: Rene Paci, MD;  Location: [facility];  Service: Urology;  Laterality: Right;   HERNIA REPAIR Right 2011   JOINT REPLACEMENT Right    right hip- 2016   LITHOTRIPSY     x1   POLYPECTOMY  03/05/2023   Procedure: POLYPECTOMY;  Surgeon: Dolores Frame, MD;  Location: [facility];  Service: Gastroenterology;;   RETINAL DETACHMENT SURGERY Bilateral     Home Medications:  Allergies as of 12/23/2023       Reactions   Benzonatate Cough        Medication List        Accurate as of December 23, 2023  4:08 PM.  If you have any questions, ask your nurse or doctor.          ibuprofen 200 MG tablet Commonly known as: ADVIL Take 200-400 mg by mouth every 8 (eight) hours as needed (pain.).   levocetirizine 5 MG tablet Commonly known as: XYZAL Take 5 mg by mouth daily as needed for allergies.   mometasone 50 MCG/ACT nasal spray Commonly known as: NASONEX Place 2 sprays into the nose daily as needed (allergies.).   ondansetron 8 MG disintegrating tablet Commonly known as: ZOFRAN-ODT Take 1 tablet (8 mg total) by mouth every 8 (eight) hours as needed for nausea or vomiting.   oxyCODONE-acetaminophen 5-325 MG tablet Commonly known as: Percocet Take 1 tablet by mouth every 4 (four) hours as needed for moderate pain.   oxyCODONE-acetaminophen 5-325 MG tablet Commonly known as: PERCOCET/ROXICET Take 1 tablet by mouth every 6 (six) hours as needed for severe pain.   Potassium Citrate 15 MEQ (1620 MG) Tbcr Commonly known as: Urocit-K 15 Take 1 tablet by mouth 2 (two) times daily.   rosuvastatin 10 MG tablet Commonly known as: CRESTOR Take 10 mg by mouth in the morning.   tamsulosin 0.4 MG Caps capsule Commonly known as: FLOMAX Take 1 capsule (0.4 mg total) by mouth daily.        Allergies:  Allergies  Allergen Reactions   Benzonatate Cough    Family History: No family history on file.  Social History:  reports that he has never smoked.  He has never used smokeless tobacco. He reports that he does not drink alcohol and does not use drugs.  ROS: All other review of systems were reviewed and are negative except what is noted above in HPI  Physical Exam: BP 119/75   Pulse 82   Constitutional:  Alert and oriented, No acute distress. HEENT: [place] AT, moist mucus membranes.  Trachea midline, no masses. Cardiovascular: No clubbing, cyanosis, or edema. Respiratory: Normal respiratory effort, no increased work of breathing. GI: Abdomen is soft, nontender, nondistended, no abdominal  masses GU: No CVA tenderness.  Lymph: No cervical or inguinal lymphadenopathy. Skin: No rashes, bruises or suspicious lesions. Neurologic: Grossly intact, no focal deficits, moving all 4 extremities. Psychiatric: Normal mood and affect.  Laboratory Data: Lab Results  Component Value Date   WBC 13.9 (H) 05/29/2023   HGB 14.7 05/29/2023   HCT 44.9 05/29/2023   MCV 86.0 05/29/2023   PLT 275 05/29/2023    Lab Results  Component Value Date   CREATININE 0.97 07/23/2023    No results found for: "PSA"  No results found for: "TESTOSTERONE"  No results found for: "HGBA1C"  Urinalysis    Component Value Date/Time   COLORURINE YELLOW 05/29/2023 0148   APPEARANCEUR Clear 07/08/2023 1552   LABSPEC 1.016 05/29/2023 0148   PHURINE 8.0 05/29/2023 0148   GLUCOSEU Negative 07/08/2023 1552   HGBUR MODERATE (A) 05/29/2023 0148   BILIRUBINUR Negative 07/08/2023 1552   KETONESUR NEGATIVE 05/29/2023 0148   PROTEINUR Negative 07/08/2023 1552   PROTEINUR NEGATIVE 05/29/2023 0148   UROBILINOGEN 0.2 10/31/2010 1138   NITRITE Negative 07/08/2023 1552   NITRITE NEGATIVE 05/29/2023 0148   LEUKOCYTESUR Negative 07/08/2023 1552   LEUKOCYTESUR NEGATIVE 05/29/2023 0148    Lab Results  Component Value Date   LABMICR Comment 07/08/2023   BACTERIA NONE SEEN 05/29/2023    Pertinent Imaging: KUb today: Images reviewed and discussed and with the patient  Results for orders placed in visit on 05/29/23  DG Abd 1 View  Narrative CLINICAL DATA:  Nephrolithiasis  EXAM: ABDOMEN - 1 VIEW  COMPARISON:  03/08/2020  FINDINGS: The bowel gas pattern is normal. 7 mm calcification overlies the left kidney.  IMPRESSION: Calcification consistent left-sided stone. Unremarkable bowel gas pattern.   Electronically Signed By: Layla Maw M.D. On: 06/11/2023 19:41  No results found for this or any previous visit.  No results found for this or any previous visit.  No results found for this  or any previous visit.  No results found for this or any previous visit.  No results found for this or any previous visit.  No results found for this or any previous visit.  Results for orders placed during the hospital encounter of 05/29/23  CT Renal Stone Study  Narrative CLINICAL DATA:  Lower abdominal pain, flank pain  EXAM: CT ABDOMEN AND PELVIS WITHOUT CONTRAST  TECHNIQUE: Multidetector CT imaging of the abdomen and pelvis was performed following the standard protocol without IV contrast.  RADIATION DOSE REDUCTION: This exam was performed according to the departmental dose-optimization program which includes automated exposure control, adjustment of the mA and/or kV according to patient size and/or use of iterative reconstruction technique.  COMPARISON:  03/07/2020  FINDINGS: Lower chest: No acute abnormality  Hepatobiliary: No focal hepatic abnormality. Gallbladder unremarkable.  Pancreas: No focal abnormality or ductal dilatation.  Spleen: No focal abnormality.  Normal size.  Adrenals/Urinary Tract: Multiple bilateral renal stones, the largest in the left lower pole measuring 7 mm. Mild right hydronephrosis  and perinephric stranding. This is due to 7 mm right UVJ stone. No hydronephrosis on the left. Adrenal glands and urinary bladder unremarkable.  Stomach/Bowel: Stomach, large and small bowel grossly unremarkable.  Vascular/Lymphatic: No evidence of aneurysm or adenopathy.  Reproductive: No visible focal abnormality.  Other: No free fluid or free air.  Musculoskeletal: No acute bony abnormality.  IMPRESSION: 7 mm right UVJ stone with mild right hydronephrosis and perinephric stranding.  Bilateral nephrolithiasis.   Electronically Signed By: Charlett Nose M.D. On: 05/29/2023 03:22   Assessment & Plan:    1. Nephrolithiasis (Primary) Continue urocitK   -We discussed the management of kidney stones. These options include observation,  ureteroscopy, shockwave lithotripsy (ESWL) and percutaneous nephrolithotomy (PCNL). We discussed which options are relevant to the patient's stone(s). We discussed the natural history of kidney stones as well as the complications of untreated stones and the impact on quality of life without treatment as well as with each of the above listed treatments. We also discussed the efficacy of each treatment in its ability to clear the stone burden. With any of these management options I discussed the signs and symptoms of infection and the need for emergent treatment should these be experienced. For each option we discussed the ability of each procedure to clear the patient of their stone burden.   For observation I described the risks which include but are not limited to silent renal damage, life-threatening infection, need for emergent surgery, failure to pass stone and pain.   For ureteroscopy I described the risks which include bleeding, infection, damage to contiguous structures, positioning injury, ureteral stricture, ureteral avulsion, ureteral injury, need for prolonged ureteral stent, inability to perform ureteroscopy, need for an interval procedure, inability to clear stone burden, stent discomfort/pain, heart attack, stroke, pulmonary embolus and the inherent risks with general anesthesia.   For shockwave lithotripsy I described the risks which include arrhythmia, kidney contusion, kidney hemorrhage, need for transfusion, pain, inability to adequately break up stone, inability to pass stone fragments, Steinstrasse, infection associated with obstructing stones, need for alternate surgical procedure, need for repeat shockwave lithotripsy, MI, CVA, PE and the inherent risks with anesthesia/conscious sedation.   For PCNL I described the risks including positioning injury, pneumothorax, hydrothorax, need for chest tube, inability to clear stone burden, renal laceration, arterial venous fistula or  malformation, need for embolization of kidney, loss of kidney or renal function, need for repeat procedure, need for prolonged nephrostomy tube, ureteral avulsion, MI, CVA, PE and the inherent risks of general anesthesia.   - The patient would like to proceed with observation. Patient will call if he desires ESWL - Urinalysis, Routine w reflex microscopic   No follow-ups on file.  Wilkie Aye, MD  [facility] [place]

## 2023-12-24 LAB — MICROSCOPIC EXAMINATION

## 2023-12-24 LAB — URINALYSIS, ROUTINE W REFLEX MICROSCOPIC
Bilirubin, UA: NEGATIVE
Glucose, UA: NEGATIVE
Ketones, UA: NEGATIVE
Leukocytes,UA: NEGATIVE
Nitrite, UA: NEGATIVE
Protein,UA: NEGATIVE
Specific Gravity, UA: 1.03 (ref 1.005–1.030)
Urobilinogen, Ur: 0.2 mg/dL (ref 0.2–1.0)
pH, UA: 6 (ref 5.0–7.5)

## 2023-12-29 ENCOUNTER — Encounter: Payer: Self-pay | Admitting: Urology

## 2023-12-29 ENCOUNTER — Other Ambulatory Visit: Payer: Self-pay

## 2023-12-29 DIAGNOSIS — N200 Calculus of kidney: Secondary | ICD-10-CM

## 2023-12-29 NOTE — Patient Instructions (Signed)
 ESWL for Kidney Stones  Extracorporeal shock wave lithotripsy (ESWL) is a treatment that can help break up kidney stones that are too large to pass on their own.  This is a nonsurgical procedure that breaks up a kidney stone with shock waves. These shock waves pass through your body and focus on the kidney stone. They cause the kidney stone to break into smaller pieces (fragments) while it is still in the urinary tract. The fragments of stone can pass more easily out of your body in the pee (urine). Tell a health care provider about: Any allergies you have. All medicines you are taking, including vitamins, herbs, eye drops, creams, and over-the-counter medicines. Any problems you or family members have had with anesthetic medicines. Any bleeding problems you have. Any surgeries you've had. Any medical conditions you have. Whether you're pregnant or may be pregnant. What are the risks? Your health care provider will talk with you about risks. These may include: Infection. Bleeding from the kidney. Bruising of the kidney or skin. Scarring of the kidney. This can lead to: Increased blood pressure. Poor kidney function. Return (recurrence) of kidney stones. Damage to other structures or organs. This may include the liver, colon, spleen, or pancreas. Blockage (obstruction) of the tube that carries pee from the kidney to the bladder (ureter). Failure of the kidney stone to break into fragments. What happens before the procedure? When to stop eating and drinking Follow instructions from your health care provider about what you may eat and drink. These may include: 8 hours before your procedure Stop eating most foods. Do not eat meat, fried foods, or fatty foods. Eat only light foods, such as toast or crackers. All liquids are okay except energy drinks and alcohol. 6 hours before your procedure Stop eating. Drink only clear liquids, such as water, clear fruit juice, black coffee, plain tea,  and sports drinks. Do not drink energy drinks or alcohol. 2 hours before your procedure Stop drinking all liquids. You may be allowed to take medicines with small sips of water. If you do not follow your health care provider's instructions, your procedure may be delayed or canceled. Medicines Ask your health care provider about: Changing or stopping your regular medicines. These include any diabetes medicines or blood thinners you take. Taking medicines such as aspirin and ibuprofen. These medicines can thin your blood. Do not take them unless your health care provider tells you to. Taking over-the-counter medicines, vitamins, herbs, and supplements. Tests You may have tests, such as: Blood tests. Pee (urine) tests. Imaging tests. This may include a CT scan. Surgery safety Ask your health care provider: How your surgery site will be marked. What steps will be taken to help prevent infection. These steps may include: Washing skin with a soap that kills germs. Receiving antibiotics. General instructions If you will be going home right after the procedure, plan to have a responsible adult: Take you home from the hospital or clinic. You will not be allowed to drive. Care for you for the time you are told. What happens during the procedure?  An IV will be inserted into one of your veins. You may be given: A sedative. This helps you relax. Anesthesia. This will: Numb certain areas of your body. Make you fall asleep for surgery. A water-filled cushion may be placed behind your kidney or on your abdomen. In some cases, you may be placed in a tub of lukewarm water. Your body will be positioned in a way that makes it  easier to target the kidney stone. An X-ray or ultrasound exam will be done to locate your stone. Shock waves will be aimed at the stone. If you are awake, you may feel a tapping sensation as the shock waves pass through your body. A small mesh tube (stent) may be placed in  your ureter. This will help keep pee flowing from the kidney if the fragments of the stone have been blocking the ureter. The stent will be removed at a later time by your health care provider. The procedure may vary among health care providers and hospitals. What happens after the procedure? Your blood pressure, heart rate, breathing rate, and blood oxygen level will be monitored until you leave the hospital or clinic. You may have an X-ray after the procedure to see how many of the kidney stones were broken up. This will also show how much of the stone has passed. If there are still large fragments after treatment, you may need to have a second procedure at a later time. This information is not intended to replace advice given to you by your health care provider. Make sure you discuss any questions you have with your health care provider. Document Revised: 03/21/2023 Document Reviewed: 01/09/2022 Elsevier Patient Education  2024 [employer].

## 2024-02-10 ENCOUNTER — Other Ambulatory Visit

## 2024-02-10 DIAGNOSIS — Z01818 Encounter for other preprocedural examination: Secondary | ICD-10-CM

## 2024-02-10 LAB — URINALYSIS, ROUTINE W REFLEX MICROSCOPIC
Bilirubin, UA: NEGATIVE
Glucose, UA: NEGATIVE
Ketones, UA: NEGATIVE
Leukocytes,UA: NEGATIVE
Nitrite, UA: NEGATIVE
Protein,UA: NEGATIVE
RBC, UA: NEGATIVE
Specific Gravity, UA: 1.02 (ref 1.005–1.030)
Urobilinogen, Ur: 0.2 mg/dL (ref 0.2–1.0)
pH, UA: 7 (ref 5.0–7.5)

## 2024-02-12 ENCOUNTER — Encounter (HOSPITAL_COMMUNITY)
Admission: RE | Admit: 2024-02-12 | Discharge: 2024-02-12 | Disposition: A | Discharge status: Home / Self Care | Source: Ambulatory Visit | Attending: Urology | Admitting: Urology

## 2024-02-12 ENCOUNTER — Telehealth: Payer: Self-pay | Admitting: Urology

## 2024-02-12 ENCOUNTER — Encounter (HOSPITAL_COMMUNITY): Payer: Self-pay

## 2024-02-12 NOTE — Telephone Encounter (Signed)
 Needs a note for missing work for procedure next week.

## 2024-02-16 ENCOUNTER — Ambulatory Visit (HOSPITAL_COMMUNITY)
Admission: RE | Admit: 2024-02-16 | Discharge: 2024-02-16 | Disposition: A | Discharge status: Home / Self Care | Attending: Urology | Admitting: Urology

## 2024-02-16 ENCOUNTER — Encounter (HOSPITAL_COMMUNITY)
Admission: RE | Disposition: A | Discharge status: Home / Self Care | Payer: Self-pay | Source: Home / Self Care | Attending: Urology

## 2024-02-16 ENCOUNTER — Ambulatory Visit (HOSPITAL_COMMUNITY)

## 2024-02-16 DIAGNOSIS — N132 Hydronephrosis with renal and ureteral calculous obstruction: Secondary | ICD-10-CM | POA: Diagnosis not present

## 2024-02-16 DIAGNOSIS — N200 Calculus of kidney: Secondary | ICD-10-CM

## 2024-02-16 HISTORY — PX: EXTRACORPOREAL SHOCK WAVE LITHOTRIPSY: SHX1557

## 2024-02-16 SURGERY — LITHOTRIPSY, ESWL
Anesthesia: LOCAL | Laterality: Left

## 2024-02-16 MED ORDER — OXYCODONE-ACETAMINOPHEN 5-325 MG PO TABS: 1.0000 | ORAL_TABLET | ORAL | 0 refills | Status: AC | PRN

## 2024-02-16 MED ORDER — TAMSULOSIN HCL 0.4 MG PO CAPS: 0.4000 mg | ORAL_CAPSULE | Freq: Every day | ORAL | 0 refills | Status: DC

## 2024-02-16 MED ORDER — OXYCODONE-ACETAMINOPHEN 5-325 MG PO TABS: 1.0000 | ORAL_TABLET | ORAL | 0 refills | Status: DC | PRN

## 2024-02-16 MED ORDER — DIPHENHYDRAMINE HCL 25 MG PO CAPS
25.0000 mg | ORAL_CAPSULE | ORAL | Status: AC
  Administered 2024-02-16: 25 mg via ORAL
  Filled 2024-02-16: qty 1

## 2024-02-16 MED ORDER — ONDANSETRON 8 MG PO TBDP: 8.0000 mg | ORAL_TABLET | Freq: Three times a day (TID) | ORAL | 1 refills | Status: AC | PRN

## 2024-02-16 MED ORDER — SODIUM CHLORIDE 0.9 % IV SOLN: Freq: Once | INTRAVENOUS | Status: DC

## 2024-02-16 MED ORDER — DIAZEPAM 5 MG PO TABS
10.0000 mg | ORAL_TABLET | Freq: Once | ORAL | Status: AC
  Administered 2024-02-16: 10 mg via ORAL
  Filled 2024-02-16: qty 2

## 2024-02-16 NOTE — H&P (Signed)
 Nephrolithiasis     HPI: Mr Lall is a 63yo here for ESWL for a left renal calculus. KUB fos 3x29mm left lower pole calculus. No flank pain. NO worsening LUTS. He is on potassium citrate  15meg BID.      PMH:     Past Medical History:  Diagnosis Date   Arthritis     GERD (gastroesophageal reflux disease)     History of kidney stones     Hypercholesteremia     Hypercholesteremia            Surgical History:      Past Surgical History:  Procedure Laterality Date   CATARACT EXTRACTION, BILATERAL Bilateral     COLONOSCOPY N/A 08/22/2013    Procedure: COLONOSCOPY;  Surgeon: Alyce Jubilee, MD;  Location: [facility];  Service: Endoscopy;  Laterality: N/A;  8:30 AM   COLONOSCOPY WITH PROPOFOL  N/A 03/05/2023    Procedure: COLONOSCOPY WITH PROPOFOL ;  Surgeon: Urban Garden, MD;  Location: [facility];  Service: Gastroenterology;  Laterality: N/A;  12:45pm;asa 1-2   CYSTOSCOPY/RETROGRADE/URETEROSCOPY/STONE EXTRACTION WITH BASKET       ESOPHAGOGASTRODUODENOSCOPY N/A 10/11/2014    Procedure: ESOPHAGOGASTRODUODENOSCOPY (EGD);  Surgeon: Tobin Forts, MD;  Location: [facility] END[facility]COPY;  Service: Endoscopy;  Laterality: N/A;   EXTRACORPOREAL SHOCK WAVE LITHOTRIPSY Right 03/08/2020    Procedure: EXTRACORPOREAL SHOCK WAVE LITHOTRIPSY (ESWL);  Surgeon: Adelbert Homans, MD;  Location: [facility];  Service: Urology;  Laterality: Right;   HERNIA REPAIR Right 2011   JOINT REPLACEMENT Right      right hip- 2016   LITHOTRIPSY        x1   POLYPECTOMY   03/05/2023    Procedure: POLYPECTOMY;  Surgeon: Urban Garden, MD;  Location: [facility];  Service: Gastroenterology;;   RETINAL DETACHMENT SURGERY Bilateral            Home Medications:  Allergies as of 12/23/2023         Reactions    Benzonatate Cough            Medication List           Accurate as of December 23, 2023  4:08 PM. If you have any questions, ask your nurse or doctor.              ibuprofen 200  MG tablet Commonly known as: ADVIL Take 200-400 mg by mouth every 8 (eight) hours as needed (pain.).    levocetirizine 5 MG tablet Commonly known as: XYZAL Take 5 mg by mouth daily as needed for allergies.    mometasone 50 MCG/ACT nasal spray Commonly known as: NASONEX Place 2 sprays into the nose daily as needed (allergies.).    ondansetron  8 MG disintegrating tablet Commonly known as: ZOFRAN -ODT Take 1 tablet (8 mg total) by mouth every 8 (eight) hours as needed for nausea or vomiting.    oxyCODONE -acetaminophen  5-325 MG tablet Commonly known as: Percocet  Take 1 tablet by mouth every 4 (four) hours as needed for moderate pain.    oxyCODONE -acetaminophen  5-325 MG tablet Commonly known as: PERCOCET /ROXICET Take 1 tablet by mouth every 6 (six) hours as needed for severe pain.    Potassium Citrate  15 MEQ (1620 MG) Tbcr Commonly known as: Urocit-K  15 Take 1 tablet by mouth 2 (two) times daily.    rosuvastatin 10 MG tablet Commonly known as: CRESTOR Take 10 mg by mouth in the morning.    tamsulosin  0.4 MG Caps capsule Commonly known as: FLOMAX  Take 1  capsule (0.4 mg total) by mouth daily.             Allergies:  Allergies      Allergies  Allergen Reactions   Benzonatate Cough        Family History: No family history on file.       Social History:  reports that he has never smoked. He has never used smokeless tobacco. He reports that he does not drink alcohol and does not use drugs.   ROS: All other review of systems were reviewed and are negative except what is noted above in HPI   Physical Exam: BP 119/75   Pulse 82   Constitutional:  Alert and oriented, No acute distress. HEENT: [place] AT, moist mucus membranes.  Trachea midline, no masses. Cardiovascular: No clubbing, cyanosis, or edema. Respiratory: Normal respiratory effort, no increased work of breathing. GI: Abdomen is soft, nontender, nondistended, no abdominal masses GU: No CVA tenderness.  Lymph:  No cervical or inguinal lymphadenopathy. Skin: No rashes, bruises or suspicious lesions. Neurologic: Grossly intact, no focal deficits, moving all 4 extremities. Psychiatric: Normal mood and affect.   Laboratory Data: Recent Labs       Lab Results  Component Value Date    WBC 13.9 (H) 05/29/2023    HGB 14.7 05/29/2023    HCT 44.9 05/29/2023    MCV 86.0 05/29/2023    PLT 275 05/29/2023        Recent Labs       Lab Results  Component Value Date    CREATININE 0.97 07/23/2023        Recent Labs  No results found for: "PSA"     Recent Labs  No results found for: "TESTOSTERONE"     Recent Labs  No results found for: "HGBA1C"     Urinalysis Labs (Brief)          Component Value Date/Time    COLORURINE YELLOW 05/29/2023 0148    APPEARANCEUR Clear 07/08/2023 1552    LABSPEC 1.016 05/29/2023 0148    PHURINE 8.0 05/29/2023 0148    GLUCOSEU Negative 07/08/2023 1552    HGBUR MODERATE (A) 05/29/2023 0148    BILIRUBINUR Negative 07/08/2023 1552    KETONESUR NEGATIVE 05/29/2023 0148    PROTEINUR Negative 07/08/2023 1552    PROTEINUR NEGATIVE 05/29/2023 0148    UROBILINOGEN 0.2 10/31/2010 1138    NITRITE Negative 07/08/2023 1552    NITRITE NEGATIVE 05/29/2023 0148    LEUKOCYTESUR Negative 07/08/2023 1552    LEUKOCYTESUR NEGATIVE 05/29/2023 0148        Recent Labs       Lab Results  Component Value Date    LABMICR Comment 07/08/2023    BACTERIA NONE SEEN 05/29/2023        Pertinent Imaging: KUb today: Images reviewed and discussed and with the patient  Results for orders placed in visit on 05/29/23   DG Abd 1 View   Narrative CLINICAL DATA:  Nephrolithiasis   EXAM: ABDOMEN - 1 VIEW   COMPARISON:  03/08/2020   FINDINGS: The bowel gas pattern is normal. 7 mm calcification overlies the left kidney.   IMPRESSION: Calcification consistent left-sided stone. Unremarkable bowel gas pattern.     Electronically Signed By: Sydell Eva M.D. On:  06/11/2023 19:41   No results found for this or any previous visit.   No results found for this or any previous visit.   No results found for this or any previous visit.   No results found for  this or any previous visit.   No results found for this or any previous visit.   No results found for this or any previous visit.   Results for orders placed during the hospital encounter of 05/29/23   CT Renal Stone Study   Narrative CLINICAL DATA:  Lower abdominal pain, flank pain   EXAM: CT ABDOMEN AND PELVIS WITHOUT CONTRAST   TECHNIQUE: Multidetector CT imaging of the abdomen and pelvis was performed following the standard protocol without IV contrast.   RADIATION DOSE REDUCTION: This exam was performed according to the departmental dose-optimization program which includes automated exposure control, adjustment of the mA and/or kV according to patient size and/or use of iterative reconstruction technique.   COMPARISON:  03/07/2020   FINDINGS: Lower chest: No acute abnormality   Hepatobiliary: No focal hepatic abnormality. Gallbladder unremarkable.   Pancreas: No focal abnormality or ductal dilatation.   Spleen: No focal abnormality.  Normal size.   Adrenals/Urinary Tract: Multiple bilateral renal stones, the largest in the left lower pole measuring 7 mm. Mild right hydronephrosis and perinephric stranding. This is due to 7 mm right UVJ stone. No hydronephrosis on the left. Adrenal glands and urinary bladder unremarkable.   Stomach/Bowel: Stomach, large and small bowel grossly unremarkable.   Vascular/Lymphatic: No evidence of aneurysm or adenopathy.   Reproductive: No visible focal abnormality.   Other: No free fluid or free air.   Musculoskeletal: No acute bony abnormality.   IMPRESSION: 7 mm right UVJ stone with mild right hydronephrosis and perinephric stranding.   Bilateral nephrolithiasis.     Electronically Signed By: Janeece Mechanic M.D. On:  05/29/2023 03:22     Assessment & Plan:     1. Nephrolithiasis (Primary) Continue urocitK    -We discussed the management of kidney stones. These options include observation, ureteroscopy, shockwave lithotripsy (ESWL) and percutaneous nephrolithotomy (PCNL). We discussed which options are relevant to the patient's stone(s). We discussed the natural history of kidney stones as well as the complications of untreated stones and the impact on quality of life without treatment as well as with each of the above listed treatments. We also discussed the efficacy of each treatment in its ability to clear the stone burden. With any of these management options I discussed the signs and symptoms of infection and the need for emergent treatment should these be experienced. For each option we discussed the ability of each procedure to clear the patient of their stone burden.   For observation I described the risks which include but are not limited to silent renal damage, life-threatening infection, need for emergent surgery, failure to pass stone and pain.   For ureteroscopy I described the risks which include bleeding, infection, damage to contiguous structures, positioning injury, ureteral stricture, ureteral avulsion, ureteral injury, need for prolonged ureteral stent, inability to perform ureteroscopy, need for an interval procedure, inability to clear stone burden, stent discomfort/pain, heart attack, stroke, pulmonary embolus and the inherent risks with general anesthesia.   For shockwave lithotripsy I described the risks which include arrhythmia, kidney contusion, kidney hemorrhage, need for transfusion, pain, inability to adequately break up stone, inability to pass stone fragments, Steinstrasse, infection associated with obstructing stones, need for alternate surgical procedure, need for repeat shockwave lithotripsy, MI, CVA, PE and the inherent risks with anesthesia/conscious sedation.   For PCNL I  described the risks including positioning injury, pneumothorax, hydrothorax, need for chest tube, inability to clear stone burden, renal laceration, arterial venous fistula or malformation, need for embolization of  kidney, loss of kidney or renal function, need for repeat procedure, need for prolonged nephrostomy tube, ureteral avulsion, MI, CVA, PE and the inherent risks of general anesthesia.   - The patient would like to proceed with observation. Patient will call if he desires ESWL - Urinalysis, Routine w reflex microscopic     No follow-ups on file.   Johnie Nailer, MD   [facility] [place]

## 2024-02-16 NOTE — Progress Notes (Signed)
 Strawberry rash over left flank, treated area.

## 2024-02-17 ENCOUNTER — Encounter (HOSPITAL_COMMUNITY): Payer: Self-pay | Admitting: Urology

## 2024-02-17 NOTE — Telephone Encounter (Signed)
 Verbal from Dr. Claretta Croft to send work note excusing pt from work on 05/27 and 05/28.  Work note created and sent to patient mychart per pt preference.

## 2024-02-18 ENCOUNTER — Telehealth: Payer: Self-pay | Admitting: Urology

## 2024-02-18 NOTE — Telephone Encounter (Signed)
 Patient is aware of NP response.  He is asking for his work note to be extended due to still having pain and having to take pain medication.  Can we update his work note and have him return on Monday?

## 2024-02-18 NOTE — Telephone Encounter (Signed)
 Patient states that his pain is when he sleeps on the side he had his treatment on, he states that when he tried to work he felt that he was passing a stone.  I informed him this could be normal if his stone was moving.  He states his pain is gone now, he did take his pain medication for relief. Patient denies fever or any other symptoms at this time.  Patient has not passed any sediment or stone since the procedure.  Do you think patient needs to be seen sooner  than his scheduled follow up with his xray?

## 2024-02-18 NOTE — Telephone Encounter (Signed)
 Had litho and went to work. He is having severe pain and he has never had this pain before after previous litho. He would like a call asap

## 2024-02-19 NOTE — Telephone Encounter (Signed)
Updated and sent via mychart

## 2024-03-02 ENCOUNTER — Encounter: Admitting: Urology

## 2024-03-11 ENCOUNTER — Other Ambulatory Visit: Payer: Self-pay | Admitting: Urology

## 2024-03-18 ENCOUNTER — Encounter: Admitting: Urology

## 2024-03-30 ENCOUNTER — Ambulatory Visit: Admitting: Urology

## 2024-07-04 ENCOUNTER — Ambulatory Visit: Admitting: Urology

## 2024-07-04 DIAGNOSIS — N200 Calculus of kidney: Secondary | ICD-10-CM

## 2024-07-06 ENCOUNTER — Encounter (INDEPENDENT_AMBULATORY_CARE_PROVIDER_SITE_OTHER): Payer: Self-pay | Admitting: Gastroenterology
